# Patient Record
Sex: Female | Born: 1985 | Race: Black or African American | Hispanic: No | Marital: Single | Smoking: Never smoker
Health system: Southern US, Community
[De-identification: ages and names within clinical notes are randomized; demographics above are authoritative.]

## PROBLEM LIST (undated history)

## (undated) ENCOUNTER — Inpatient Hospital Stay (HOSPITAL_COMMUNITY): Payer: Self-pay

## (undated) DIAGNOSIS — I10 Essential (primary) hypertension: Secondary | ICD-10-CM

## (undated) DIAGNOSIS — R51 Headache: Secondary | ICD-10-CM

## (undated) DIAGNOSIS — Z8669 Personal history of other diseases of the nervous system and sense organs: Secondary | ICD-10-CM

## (undated) DIAGNOSIS — R011 Cardiac murmur, unspecified: Secondary | ICD-10-CM

## (undated) DIAGNOSIS — R519 Headache, unspecified: Secondary | ICD-10-CM

## (undated) DIAGNOSIS — D649 Anemia, unspecified: Secondary | ICD-10-CM

## (undated) DIAGNOSIS — O009 Unspecified ectopic pregnancy without intrauterine pregnancy: Secondary | ICD-10-CM

## (undated) DIAGNOSIS — R87619 Unspecified abnormal cytological findings in specimens from cervix uteri: Secondary | ICD-10-CM

## (undated) DIAGNOSIS — IMO0002 Reserved for concepts with insufficient information to code with codable children: Secondary | ICD-10-CM

## (undated) HISTORY — PX: WISDOM TOOTH EXTRACTION: SHX21

## (undated) HISTORY — DX: Unspecified ectopic pregnancy without intrauterine pregnancy: O00.90

## (undated) HISTORY — DX: Personal history of other diseases of the nervous system and sense organs: Z86.69

## (undated) HISTORY — DX: Anemia, unspecified: D64.9

## (undated) HISTORY — PX: COLPOSCOPY: SHX161

## (undated) HISTORY — DX: Reserved for concepts with insufficient information to code with codable children: IMO0002

## (undated) HISTORY — DX: Unspecified abnormal cytological findings in specimens from cervix uteri: R87.619

---

## 2011-02-24 ENCOUNTER — Inpatient Hospital Stay (HOSPITAL_COMMUNITY)
Admission: EM | Admit: 2011-02-24 | Discharge: 2011-02-26 | DRG: 378 | Disposition: A | Discharge status: Home / Self Care | Payer: BC Managed Care – PPO | Source: Ambulatory Visit | Attending: Obstetrics and Gynecology | Admitting: Obstetrics and Gynecology

## 2011-02-24 ENCOUNTER — Ambulatory Visit: Admit: 2011-02-24 | Payer: Self-pay | Admitting: Obstetrics and Gynecology

## 2011-02-24 ENCOUNTER — Other Ambulatory Visit: Payer: Self-pay | Admitting: Obstetrics and Gynecology

## 2011-02-24 ENCOUNTER — Encounter (HOSPITAL_COMMUNITY): Payer: Self-pay

## 2011-02-24 ENCOUNTER — Encounter (HOSPITAL_COMMUNITY): Payer: Self-pay | Admitting: Anesthesiology

## 2011-02-24 ENCOUNTER — Encounter (HOSPITAL_COMMUNITY)
Admission: EM | Disposition: A | Discharge status: Home / Self Care | Payer: Self-pay | Source: Ambulatory Visit | Attending: Obstetrics and Gynecology

## 2011-02-24 ENCOUNTER — Inpatient Hospital Stay (HOSPITAL_COMMUNITY): Payer: BC Managed Care – PPO | Admitting: Anesthesiology

## 2011-02-24 DIAGNOSIS — O009 Unspecified ectopic pregnancy without intrauterine pregnancy: Secondary | ICD-10-CM

## 2011-02-24 DIAGNOSIS — O00109 Unspecified tubal pregnancy without intrauterine pregnancy: Secondary | ICD-10-CM | POA: Diagnosis present

## 2011-02-24 HISTORY — PX: LAPAROSCOPY: SHX197

## 2011-02-24 HISTORY — DX: Cardiac murmur, unspecified: R01.1

## 2011-02-24 HISTORY — DX: Unspecified ectopic pregnancy without intrauterine pregnancy: O00.90

## 2011-02-24 HISTORY — PX: UNILATERAL SALPINGECTOMY: SHX6160

## 2011-02-24 HISTORY — PX: LAPAROTOMY: SHX154

## 2011-02-24 LAB — CBC
MCHC: 33.7 g/dL (ref 30.0–36.0)
Platelets: 249 10*3/uL (ref 150–400)
RDW: 12.8 % (ref 11.5–15.5)
WBC: 6.5 10*3/uL (ref 4.0–10.5)

## 2011-02-24 SURGERY — LAPAROSCOPY OPERATIVE
Anesthesia: General | Site: Abdomen | Wound class: Clean Contaminated

## 2011-02-24 MED ORDER — ONDANSETRON HCL 4 MG PO TABS: 4.0000 mg | ORAL_TABLET | Freq: Four times a day (QID) | ORAL | Status: DC | PRN

## 2011-02-24 MED ORDER — LACTATED RINGERS IV SOLN
Freq: Once | INTRAVENOUS | Status: AC
  Administered 2011-02-24: 16:00:00 via INTRAVENOUS

## 2011-02-24 MED ORDER — LACTATED RINGERS IV SOLN
INTRAVENOUS | Status: DC | PRN
  Administered 2011-02-24 (×3): via INTRAVENOUS

## 2011-02-24 MED ORDER — FAMOTIDINE IN NACL 20-0.9 MG/50ML-% IV SOLN
20.0000 mg | Freq: Once | INTRAVENOUS | Status: AC
  Administered 2011-02-24: 20 mg via INTRAVENOUS
  Filled 2011-02-24: qty 50

## 2011-02-24 MED ORDER — GLYCOPYRROLATE 0.2 MG/ML IJ SOLN
INTRAMUSCULAR | Status: DC | PRN
  Administered 2011-02-24: .6 mg via INTRAVENOUS

## 2011-02-24 MED ORDER — HYDROCODONE-ACETAMINOPHEN 5-325 MG PO TABS
1.0000 | ORAL_TABLET | ORAL | Status: DC | PRN
  Administered 2011-02-25 – 2011-02-26 (×4): 2 via ORAL
  Filled 2011-02-24: qty 1
  Filled 2011-02-24 (×3): qty 2

## 2011-02-24 MED ORDER — ZOLPIDEM TARTRATE 5 MG PO TABS: 5.0000 mg | ORAL_TABLET | Freq: Every evening | ORAL | Status: DC | PRN

## 2011-02-24 MED ORDER — HYDROMORPHONE HCL 1 MG/ML IJ SOLN
0.2000 mg | INTRAMUSCULAR | Status: DC | PRN
  Administered 2011-02-24 – 2011-02-25 (×2): 0.6 mg via INTRAVENOUS
  Filled 2011-02-24 (×2): qty 1

## 2011-02-24 MED ORDER — CEFAZOLIN SODIUM-DEXTROSE 1-4 GM-% IV SOLR
1.0000 g | Freq: Once | INTRAVENOUS | Status: DC
  Filled 2011-02-24: qty 50

## 2011-02-24 MED ORDER — ACETAMINOPHEN 325 MG PO TABS: 325.0000 mg | ORAL_TABLET | ORAL | Status: DC | PRN

## 2011-02-24 MED ORDER — MEPERIDINE HCL 25 MG/ML IJ SOLN: 6.2500 mg | INTRAMUSCULAR | Status: DC | PRN

## 2011-02-24 MED ORDER — KETOROLAC TROMETHAMINE 30 MG/ML IJ SOLN
30.0000 mg | Freq: Once | INTRAMUSCULAR | Status: AC
  Administered 2011-02-24: 30 mg via INTRAVENOUS

## 2011-02-24 MED ORDER — NEOSTIGMINE METHYLSULFATE 1 MG/ML IJ SOLN
INTRAMUSCULAR | Status: DC | PRN
  Administered 2011-02-24: 3 mg via INTRAMUSCULAR

## 2011-02-24 MED ORDER — FENTANYL CITRATE 0.05 MG/ML IJ SOLN
INTRAMUSCULAR | Status: DC | PRN
  Administered 2011-02-24: 100 ug via INTRAVENOUS
  Administered 2011-02-24: 50 ug via INTRAVENOUS
  Administered 2011-02-24: 100 ug via INTRAVENOUS

## 2011-02-24 MED ORDER — BISACODYL 5 MG PO TBEC
5.0000 mg | DELAYED_RELEASE_TABLET | Freq: Every day | ORAL | Status: DC | PRN
  Administered 2011-02-26: 5 mg via ORAL
  Filled 2011-02-24: qty 1

## 2011-02-24 MED ORDER — ONDANSETRON HCL 4 MG/2ML IJ SOLN: 4.0000 mg | Freq: Once | INTRAMUSCULAR | Status: DC | PRN

## 2011-02-24 MED ORDER — DEXAMETHASONE SODIUM PHOSPHATE 10 MG/ML IJ SOLN
INTRAMUSCULAR | Status: DC | PRN
  Administered 2011-02-24: 10 mg via INTRAVENOUS

## 2011-02-24 MED ORDER — MIDAZOLAM HCL 5 MG/5ML IJ SOLN
INTRAMUSCULAR | Status: DC | PRN
  Administered 2011-02-24: 2 mg via INTRAVENOUS

## 2011-02-24 MED ORDER — FENTANYL CITRATE 0.05 MG/ML IJ SOLN
25.0000 ug | INTRAMUSCULAR | Status: DC | PRN
  Administered 2011-02-24 (×3): 50 ug via INTRAVENOUS

## 2011-02-24 MED ORDER — IBUPROFEN 600 MG PO TABS
600.0000 mg | ORAL_TABLET | Freq: Four times a day (QID) | ORAL | Status: DC | PRN
  Administered 2011-02-25 – 2011-02-26 (×2): 600 mg via ORAL
  Filled 2011-02-24: qty 1
  Filled 2011-02-24: qty 3
  Filled 2011-02-24: qty 1

## 2011-02-24 MED ORDER — DEXTROSE IN LACTATED RINGERS 5 % IV SOLN
INTRAVENOUS | Status: DC
  Administered 2011-02-25: 02:00:00 via INTRAVENOUS

## 2011-02-24 MED ORDER — LIDOCAINE HCL (CARDIAC) 20 MG/ML IV SOLN
INTRAVENOUS | Status: DC | PRN
  Administered 2011-02-24: 60 mg via INTRAVENOUS

## 2011-02-24 MED ORDER — CEFAZOLIN SODIUM 1-5 GM-% IV SOLN
INTRAVENOUS | Status: DC | PRN
  Administered 2011-02-24: 1 g via INTRAVENOUS

## 2011-02-24 MED ORDER — ROCURONIUM BROMIDE 100 MG/10ML IV SOLN
INTRAVENOUS | Status: DC | PRN
  Administered 2011-02-24: 50 mg via INTRAVENOUS

## 2011-02-24 MED ORDER — HYDROMORPHONE HCL 1 MG/ML IJ SOLN
INTRAMUSCULAR | Status: DC | PRN
  Administered 2011-02-24: 1 mg via INTRAVENOUS

## 2011-02-24 MED ORDER — PROPOFOL 10 MG/ML IV EMUL
INTRAVENOUS | Status: DC | PRN
  Administered 2011-02-24: 200 mg via INTRAVENOUS
  Administered 2011-02-24: 50 mg via INTRAVENOUS

## 2011-02-24 MED ORDER — CITRIC ACID-SODIUM CITRATE 334-500 MG/5ML PO SOLN
30.0000 mL | Freq: Once | ORAL | Status: AC
  Administered 2011-02-24: 30 mL via ORAL
  Filled 2011-02-24: qty 30

## 2011-02-24 MED ORDER — ALUM & MAG HYDROXIDE-SIMETH 200-200-20 MG/5ML PO SUSP
30.0000 mL | ORAL | Status: DC | PRN
  Filled 2011-02-24: qty 30

## 2011-02-24 MED ORDER — ONDANSETRON HCL 4 MG/2ML IJ SOLN
INTRAMUSCULAR | Status: DC | PRN
  Administered 2011-02-24: 4 mg via INTRAMUSCULAR

## 2011-02-24 SURGICAL SUPPLY — 46 items
BENZOIN TINCTURE PRP APPL 2/3 (GAUZE/BANDAGES/DRESSINGS) ×3 IMPLANT
CABLE HIGH FREQUENCY MONO STRZ (ELECTRODE) IMPLANT
CANISTER SUCTION 2500CC (MISCELLANEOUS) ×3 IMPLANT
CLOTH BEACON ORANGE TIMEOUT ST (SAFETY) ×3 IMPLANT
CONT PATH 16OZ SNAP LID 3702 (MISCELLANEOUS) ×3 IMPLANT
DISSECTOR SPONGE CHERRY (GAUZE/BANDAGES/DRESSINGS) IMPLANT
DRAPE UTILITY XL STRL (DRAPES) ×3 IMPLANT
DRESSING TELFA 8X3 (GAUZE/BANDAGES/DRESSINGS) ×3 IMPLANT
ELECT CAUTERY BLADE 6.4 (BLADE) ×3 IMPLANT
ELECT NEEDLE TIP 2.8 STRL (NEEDLE) ×3 IMPLANT
FORCEPS CUTTING 33CM 5MM (CUTTING FORCEPS) IMPLANT
GLOVE BIO SURGEON STRL SZ 6.5 (GLOVE) ×9 IMPLANT
GLOVE BIO SURGEON STRL SZ7 (GLOVE) ×3 IMPLANT
GLOVE BIOGEL PI IND STRL 7.0 (GLOVE) ×4 IMPLANT
GLOVE BIOGEL PI INDICATOR 7.0 (GLOVE) ×2
GOWN BRE IMP SLV AUR LG STRL (GOWN DISPOSABLE) ×6 IMPLANT
GOWN BRE IMP SLV AUR XL STRL (GOWN DISPOSABLE) ×3 IMPLANT
HEMOSTAT SURGICEL 2X3 (HEMOSTASIS) ×3 IMPLANT
NS IRRIG 1000ML POUR BTL (IV SOLUTION) ×3 IMPLANT
PACK LAPAROSCOPY BASIN (CUSTOM PROCEDURE TRAY) IMPLANT
PAD ABD 7.5X8 STRL (GAUZE/BANDAGES/DRESSINGS) ×3 IMPLANT
PAD OB MATERNITY 4.3X12.25 (PERSONAL CARE ITEMS) ×3 IMPLANT
POUCH SPECIMEN RETRIEVAL 10MM (ENDOMECHANICALS) IMPLANT
SET IRRIG TUBING LAPAROSCOPIC (IRRIGATION / IRRIGATOR) IMPLANT
SLEEVE Z-THREAD 5X100MM (TROCAR) IMPLANT
SPONGE LAP 18X18 X RAY DECT (DISPOSABLE) ×6 IMPLANT
STAPLER VISISTAT 35W (STAPLE) ×3 IMPLANT
STRIP CLOSURE SKIN 1/2X4 (GAUZE/BANDAGES/DRESSINGS) ×3 IMPLANT
SUT CHROMIC 2 0 CT 1 (SUTURE) ×3 IMPLANT
SUT PLAIN 2 0 XLH (SUTURE) ×3 IMPLANT
SUT VIC AB 0 CT1 18XCR BRD8 (SUTURE) IMPLANT
SUT VIC AB 0 CT1 36 (SUTURE) ×6 IMPLANT
SUT VIC AB 0 CT1 8-18 (SUTURE)
SUT VIC AB 4-0 KS 27 (SUTURE) ×3 IMPLANT
SUT VIC AB 4-0 PS2 18 (SUTURE) ×3 IMPLANT
SUT VIC AB 4-0 SH 27 (SUTURE) ×2
SUT VIC AB 4-0 SH 27XANBCTRL (SUTURE) ×4 IMPLANT
SUT VICRYL 0 TIES 12 18 (SUTURE) IMPLANT
SUT VICRYL 0 UR6 27IN ABS (SUTURE) ×3 IMPLANT
SYR BULB 3OZ (MISCELLANEOUS) ×3 IMPLANT
TOWEL OR 17X24 6PK STRL BLUE (TOWEL DISPOSABLE) ×6 IMPLANT
TRAY FOLEY CATH 14FR (SET/KITS/TRAYS/PACK) ×3 IMPLANT
TROCAR Z-THREAD FIOS 11X100 BL (TROCAR) IMPLANT
TROCAR Z-THREAD FIOS 5X100MM (TROCAR) ×3 IMPLANT
WARMER LAPAROSCOPE (MISCELLANEOUS) ×3 IMPLANT
WATER STERILE IRR 1000ML POUR (IV SOLUTION) ×3 IMPLANT

## 2011-02-24 NOTE — Anesthesia Postprocedure Evaluation (Signed)
  Anesthesia Post-op Note  Patient: Sarah Duran  Procedure(s) Performed:  LAPAROSCOPY OPERATIVE; EXPLORATORY LAPAROTOMY Patient is awake and responsive. Pain and nausea are reasonably well controlled. Vital signs are stable and clinically acceptable. Oxygen saturation is clinically acceptable. There are no apparent anesthetic complications at this time. Patient is ready for discharge.

## 2011-02-24 NOTE — Anesthesia Preprocedure Evaluation (Addendum)
Anesthesia Evaluation  Name, MR# and DOB Patient awake  General Assessment Comment  Reviewed: Allergy & Precautions, H&P , Patient's Chart, lab work & pertinent test results and reviewed documented beta blocker date and time   Airway Mallampati: II TM Distance: >3 FB Neck ROM: full    Dental No notable dental hx    Pulmonaryneg pulmonary ROS    clear to auscultation  pulmonary exam normal   Cardiovascular Valvular problems/murmurs: Hx of Murmur. regular Normal   Neuro/PsychNegative Neurological ROS Negative Psych ROS  GI/Hepatic/Renal negative GI ROS, negative Liver ROS, and negative Renal ROS (+)       Endo/Other  Negative Endocrine ROS (+)   Abdominal   Musculoskeletal  Hematology negative hematology ROS (+)   Peds  Reproductive/Obstetrics   Anesthesia Other Findings             Anesthesia Physical Anesthesia Plan  ASA: I  Anesthesia Plan: General   Post-op Pain Management:    Induction:   Airway Management Planned:   Additional Equipment:   Intra-op Plan:   Post-operative Plan:   Informed Consent: I have reviewed the patients History and Physical, chart, labs and discussed the procedure including the risks, benefits and alternatives for the proposed anesthesia with the patient or authorized representative who has indicated his/her understanding and acceptance.   Dental Advisory Given  Plan Discussed with: CRNA and Surgeon  Anesthesia Plan Comments: (Discussed  general anesthesia, including possible nausea, instrumentation of airway, sore throat,pulmonary aspiration, etc. I asked if the were any outstanding questions, or  concerns before we proceeded.)       Anesthesia Quick Evaluation

## 2011-02-24 NOTE — Brief Op Note (Signed)
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   Delay start of Pharmacological VTE agent (>24hrs) due to surgical blood loss or risk of bleeding:  not applicable

## 2011-02-24 NOTE — Anesthesia Procedure Notes (Addendum)
Procedure Name: Intubation Performed by: Fanny Dance Pre-anesthesia Checklist: Patient identified, Patient being monitored, Timeout performed, Emergency Drugs available and Suction available Patient Re-evaluated:Patient Re-evaluated prior to inductionOxygen Delivery Method: Circle System Utilized Preoxygenation: Pre-oxygenation with 100% oxygen Intubation Type: IV induction Ventilation: Mask ventilation without difficulty Laryngoscope Size: Miller and 2 Laryngoscope size: Miller and 2 Grade View: Grade II Tube type: Oral Tube size: 7.0 mm Number of attempts: 1 Airway Equipment and Method: stylet Placement Confirmation: ETT inserted through vocal cords under direct vision,  positive ETCO2 and breath sounds checked- equal and bilateral Secured at: 21 cm Tube secured with: Tape Dental Injury: Teeth and Oropharynx as per pre-operative assessment

## 2011-02-24 NOTE — Transfer of Care (Signed)
Immediate Anesthesia Transfer of Care Note  Patient: Sarah Duran  Procedure(s) Performed:  LAPAROSCOPY OPERATIVE; EXPLORATORY LAPAROTOMY  Patient Location: PACU  Anesthesia Type: General  Level of Consciousness: oriented and sedated  Airway & Oxygen Therapy: Patient Spontanous Breathing and Patient connected to nasal cannula oxygen  Post-op Assessment: Report given to PACU RN and Post -op Vital signs reviewed and stable  Post vital signs: Reviewed and stable  Complications: No apparent anesthesia complications

## 2011-02-24 NOTE — Progress Notes (Signed)
Pt sent from the office with orders for AOS. Pt states no pain with a little spotting.

## 2011-02-25 LAB — CBC
Hemoglobin: 10.3 g/dL — ABNORMAL LOW (ref 12.0–15.0)
Platelets: 215 10*3/uL (ref 150–400)
RBC: 3.77 MIL/uL — ABNORMAL LOW (ref 3.87–5.11)
WBC: 11.4 10*3/uL — ABNORMAL HIGH (ref 4.0–10.5)

## 2011-02-25 MED ORDER — CEPHALEXIN 500 MG PO CAPS
500.0000 mg | ORAL_CAPSULE | Freq: Three times a day (TID) | ORAL | Status: DC
  Administered 2011-02-25 – 2011-02-26 (×3): 500 mg via ORAL
  Filled 2011-02-25 (×4): qty 1

## 2011-02-25 MED ORDER — SIMETHICONE 80 MG PO CHEW
80.0000 mg | CHEWABLE_TABLET | Freq: Four times a day (QID) | ORAL | Status: DC | PRN
  Administered 2011-02-25: 80 mg via ORAL

## 2011-02-25 NOTE — Addendum Note (Signed)
Addendum  created 02/25/11 0945 by Fanny Dance   Modules edited:Anesthesia Flowsheet

## 2011-02-25 NOTE — Op Note (Signed)
NAMEAMIT, MELOY NO.:  [id number]  MEDICAL RECORD NO.:  [id number]  LOCATION:  9310                          FACILITY:  [facility]  PHYSICIAN:  Arlyce Harman, MD     DATE OF BIRTH:  [birth year]  DATE OF PROCEDURE: DATE OF DISCHARGE:                              OPERATIVE REPORT   PREOPERATIVE DIAGNOSIS:  Ectopic pregnancy.  POSTOPERATIVE DIAGNOSIS:  Ectopic pregnancy.  OPERATION:  Diagnostic laparoscopy or exploratory laparotomy with right linear salpingostomy and removal of ectopic pregnancy, lysis of adhesions.  SURGEON:  Arlyce Harman, MD  ASSISTANT:  None.  DESCRIPTION OF FINDINGS:  Right tubal pregnancy, adhesions of the right tube and ovary.  INDICATIONS AND CONSENT:  The patient is a 25 year old gravida 2, para 1, previous C-section patient who presented to the office for an exam. She denies pelvic pain.  Denies vaginal bleeding.  However, on exam, vaginal bleeding was noted by Dr. Fonnie Jarvis.  The patient is 9 weeks 5 days.  Also, the uterus did not feel that large, therefore, ultrasound was planned.  Findings were highly suggestive of an right ectopic pregnancy with a right adnexal mass separated from the uterus, the mass measuring 2.2 cm.  There was consistent with a tubal pregnancy.  The uterus was empty and within normal limits size and in the right adnexa, there appeared a small corpus luteum cyst.  The patient started to have bleeding on February 23, 2011, and reported no pain.  Because of the ultrasound findings and the quant level at over 14,000, they came back. The decision was made for laparoscopy and possible laparotomy.  Options for therapy was thoroughly discussed and consent was given.  PROCEDURE:  The patient was placed on the table in the supine position, after general anesthesia has been induced, after the patient had been prepped and draped, she was placed in a dorsal lithotomy position and then the bladder was drained via Foley  catheter.  An infraumbilical incision was made and the trocar inserted without difficulty.  We confirmed we are in the peritoneal cavity and at this point, CO2 was insufflated into the abdomen and the laparoscope was inserted.  Upon inspection of the pelvic cavity, there was no hemoperitoneum.  The right tubes and ovary looks normal.  There was an approximately 2-3 cm dilation on the right adnexal area consistent with a tubal pregnancy. There were some adhesions to that ovary on that side, they were lysed with a Bovie and released.  The right tube was gently grasped and the gestational sac was squeezed out of the mid portion of the tube.  After the mass had been removed, we can see fallopian tube was intact, ectopic pregnancy from the right fallopian tube.  SPECIMEN:  Ectopic.  DISPOSITION:  Continue meds at home.     Arlyce Harman, MD     EG/MEDQ  D:  02/24/2011  T:  02/25/2011  Job:  [id number]

## 2011-02-26 NOTE — Progress Notes (Signed)
Patient seems to be coping well.  She does not report feelings of grief over pregnancy at this time.  However, reports her boyfriend is grieving.  He was not present - two friends were with her.  Gave her ectopic Comfort packet and discussed information and resources available.

## 2011-02-26 NOTE — Progress Notes (Signed)
2 Days Post-Op Procedure(s) (LRB): LAPAROSCOPY OPERATIVE (N/A) EXPLORATORY LAPAROTOMY (N/A)  Subjective: Patient reports tolerating PO, + flatus and no problems voiding.    Objective: I have reviewed patient's vital signs, medications and labs.  General: alert, cooperative and no distress  Assessment: s/p Procedure(s): LAPAROSCOPY OPERATIVE EXPLORATORY LAPAROTOMY: stable, progressing well and tolerating diet  Plan: Discharge home  LOS: 2 days    Justyn Boyson E 02/26/2011, 8:54 AM

## 2011-02-26 NOTE — Progress Notes (Addendum)
1 day post op Procedure(s) (LRB): LAPAROSCOPY OPERATIVE (N/A) EXPLORATORY LAPAROTOMY (N/A)  Subjective: Patient reports tolerating PO.    Objective: I have reviewed patient's vital signs, intake and output, medications and labs.  General: alert and no distress  Assessment: s/p Procedure(s): LAPAROSCOPY OPERATIVE EXPLORATORY LAPAROTOMY: stable  Plan: Encourage ambulation Discontinue IV fluids    Isiac Breighner E 02/26/2011, 8:47 AM

## 2011-03-11 NOTE — Discharge Summary (Signed)
2 Days Post-Op Procedure(s) (LRB):  LAPAROSCOPY OPERATIVE (N/A)  EXPLORATORY LAPAROTOMY (N/A)  Subjective:  Patient reports tolerating PO, + flatus and no problems voiding.  Objective:  I have reviewed patient's vital signs, medications and labs.  General: alert, cooperative and no distress  Assessment:  s/p Procedure(s):  LAPAROSCOPY OPERATIVE  EXPLORATORY LAPAROTOMY: stable, progressing well and tolerating diet  Plan:  Discharge home  LOS: 2 days

## 2011-03-17 ENCOUNTER — Encounter (HOSPITAL_COMMUNITY): Payer: Self-pay | Admitting: Obstetrics and Gynecology

## 2011-03-26 ENCOUNTER — Inpatient Hospital Stay (HOSPITAL_COMMUNITY): Admit: 2011-03-26 | Payer: Self-pay

## 2011-08-13 ENCOUNTER — Other Ambulatory Visit (HOSPITAL_COMMUNITY)
Admission: RE | Admit: 2011-08-13 | Discharge: 2011-08-13 | Disposition: A | Discharge status: Home / Self Care | Payer: BC Managed Care – PPO | Source: Ambulatory Visit | Attending: Obstetrics and Gynecology | Admitting: Obstetrics and Gynecology

## 2011-08-13 ENCOUNTER — Other Ambulatory Visit: Payer: Self-pay | Admitting: Obstetrics and Gynecology

## 2011-08-13 DIAGNOSIS — Z124 Encounter for screening for malignant neoplasm of cervix: Secondary | ICD-10-CM | POA: Insufficient documentation

## 2011-08-13 DIAGNOSIS — Z113 Encounter for screening for infections with a predominantly sexual mode of transmission: Secondary | ICD-10-CM | POA: Insufficient documentation

## 2011-09-10 ENCOUNTER — Other Ambulatory Visit: Payer: Self-pay | Admitting: Obstetrics and Gynecology

## 2012-08-17 DIAGNOSIS — I10 Essential (primary) hypertension: Secondary | ICD-10-CM

## 2012-08-17 HISTORY — DX: Essential (primary) hypertension: I10

## 2012-10-24 ENCOUNTER — Encounter: Payer: Self-pay | Admitting: Obstetrics and Gynecology

## 2012-10-24 ENCOUNTER — Ambulatory Visit: Payer: BC Managed Care – PPO | Admitting: Obstetrics and Gynecology

## 2012-10-24 VITALS — BP 100/62 | HR 68 | Ht 65.0 in | Wt 144.0 lb

## 2012-10-24 DIAGNOSIS — Z01419 Encounter for gynecological examination (general) (routine) without abnormal findings: Secondary | ICD-10-CM

## 2012-10-24 DIAGNOSIS — Z8759 Personal history of other complications of pregnancy, childbirth and the puerperium: Secondary | ICD-10-CM

## 2012-10-24 DIAGNOSIS — N912 Amenorrhea, unspecified: Secondary | ICD-10-CM

## 2012-10-24 DIAGNOSIS — Z113 Encounter for screening for infections with a predominantly sexual mode of transmission: Secondary | ICD-10-CM

## 2012-10-24 DIAGNOSIS — Z3490 Encounter for supervision of normal pregnancy, unspecified, unspecified trimester: Secondary | ICD-10-CM

## 2012-10-24 DIAGNOSIS — Z124 Encounter for screening for malignant neoplasm of cervix: Secondary | ICD-10-CM

## 2012-10-24 NOTE — Progress Notes (Signed)
Regular Periods: no Mammogram: no  Monthly Breast Ex.: yes Exercise: yes 2-3 x a week treadmill  Tetanus < 10 years: yes Seatbelts: yes  NI. Bladder Functn.: yes Abuse at home: no  Daily BM's: no qod Stressful Work: no  Healthy Diet: yes Sigmoid-Colonoscopy: no  Calcium: no Medical problems this year: amenorrhea;hx of ectopic   LAST PAP:2012  Contraception: none  Mammogram:  no  PCP: no   PMH: no change  FMH: no change  Last Bone Scan: no  Pt is single; in a relationship

## 2012-10-24 NOTE — Progress Notes (Signed)
Subjective:    Audianna Landgren is a 27 y.o. female, G2P0011, who presents for an annual exam. The patient reports a normal period in September 04, 2012 and no bleeding or pelvic discomfort since.  Menstrual cycle:   LMP: Patient's last menstrual period was 09/04/2012.  PMH: S/P colposcopy 2013 b             Review of Systems Pertinent items are noted in HPI. Denies pelvic pain, urinary tract symptoms, vaginitis symptoms, irregular bleeding, menopausal symptoms, change in bowel habits or rectal bleeding   Objective:    BP 100/62  Pulse 68  Ht 5\' 5"  (1.651 m)  Wt 144 lb (65.318 kg)  BMI 23.96 kg/m2  LMP 09/04/2012  Breastfeeding? Unknown    Wt Readings from Last 1 Encounters:  10/24/12 144 lb (65.318 kg)   Body mass index is 23.96 kg/(m^2). General Appearance: Alert, no acute distress HEENT: Grossly normal Neck / Thyroid: Supple, no thyromegaly or cervical adenopathy Lungs: Clear to auscultation bilaterally Back: No CVA tenderness Breast Exam: No masses or nodes.No dimpling, nipple retraction or discharge. Cardiovascular: Regular rate and rhythm.  Gastrointestinal: Soft, non-tender, no masses or organomegaly Pelvic Exam: EGBUS-wnl, vagina-normal rugae, cervix- without lesions or tenderness, uterus appears normal size shape and consistency, adnexae-no masses or tenderness Rectovaginal: no masses and normal sphincter tone Lymphatic Exam: Non-palpable nodes in neck, clavicular,  axillary, or inguinal regions  Skin: no rashes or abnormalities Extremities: no clubbing cyanosis or edema  Neurologic: grossly normal Psychiatric: Alert and oriented  UPT:  poisitive   Assessment:   Routine GYN Exam Pregnant  [redacted] weeks by dates H/O Ectopic-S/P Right Salpingectomy   Plan:  QHCG-pending;  STD testing-pending  If QHCG > 2500 will do ultrasound  PAP sent  RTO 1 year or prn  Harue Pribble,ELMIRAPA-C

## 2012-10-25 ENCOUNTER — Telehealth: Payer: Self-pay

## 2012-10-25 DIAGNOSIS — O0910 Supervision of pregnancy with history of ectopic or molar pregnancy, unspecified trimester: Secondary | ICD-10-CM

## 2012-10-25 NOTE — Telephone Encounter (Signed)
Informed pt quant is 114k and u/s is needed per EP. Appt scheduled 10/28/12 at 1 pm then f/u with LC. Pt agrees and has no further questions.

## 2012-10-25 NOTE — Telephone Encounter (Signed)
Message copied by Janeece Agee on Tue Oct 25, 2012  9:46 AM ------      Message from: Henreitta Leber      Created: Tue Oct 25, 2012  8:56 AM      Regarding: Need for Ultrasound       This patient has a history of ectopic and is now pregnant with a QHCG of 114K and needs an ultrasound with the diagnosis: history of ectopic, currently pregnant.  Thank you,  EP            ----- Message -----         From: [facility]         Sent: 10/25/2012   7:00 AM           To: Henreitta Leber, PA-C                   ------

## 2012-10-26 ENCOUNTER — Encounter: Payer: BC Managed Care – PPO | Admitting: Certified Nurse Midwife

## 2012-10-26 LAB — PAP IG, CT-NG, RFX HPV ASCU

## 2012-10-27 LAB — HUMAN PAPILLOMAVIRUS, HIGH RISK: HPV DNA High Risk: NOT DETECTED

## 2012-10-28 ENCOUNTER — Encounter: Payer: BC Managed Care – PPO | Admitting: Family Medicine

## 2012-11-10 DIAGNOSIS — IMO0002 Reserved for concepts with insufficient information to code with codable children: Secondary | ICD-10-CM | POA: Insufficient documentation

## 2012-11-21 ENCOUNTER — Encounter: Payer: Self-pay | Admitting: Obstetrics and Gynecology

## 2012-12-23 ENCOUNTER — Encounter (HOSPITAL_COMMUNITY): Payer: Self-pay | Admitting: Family

## 2012-12-23 ENCOUNTER — Inpatient Hospital Stay (HOSPITAL_COMMUNITY): Payer: BC Managed Care – PPO

## 2012-12-23 ENCOUNTER — Inpatient Hospital Stay (HOSPITAL_COMMUNITY)
Admission: AD | Admit: 2012-12-23 | Discharge: 2012-12-23 | Disposition: A | Discharge status: Home / Self Care | Payer: BC Managed Care – PPO | Source: Ambulatory Visit | Attending: Obstetrics and Gynecology | Admitting: Obstetrics and Gynecology

## 2012-12-23 DIAGNOSIS — M549 Dorsalgia, unspecified: Secondary | ICD-10-CM

## 2012-12-23 DIAGNOSIS — M545 Low back pain, unspecified: Secondary | ICD-10-CM | POA: Insufficient documentation

## 2012-12-23 DIAGNOSIS — N390 Urinary tract infection, site not specified: Secondary | ICD-10-CM | POA: Insufficient documentation

## 2012-12-23 DIAGNOSIS — R109 Unspecified abdominal pain: Secondary | ICD-10-CM | POA: Insufficient documentation

## 2012-12-23 DIAGNOSIS — O2390 Unspecified genitourinary tract infection in pregnancy, unspecified trimester: Secondary | ICD-10-CM | POA: Insufficient documentation

## 2012-12-23 DIAGNOSIS — O4692 Antepartum hemorrhage, unspecified, second trimester: Secondary | ICD-10-CM

## 2012-12-23 LAB — CBC
MCV: 82 fL (ref 78.0–100.0)
Platelets: 263 10*3/uL (ref 150–400)
RBC: 4.1 MIL/uL (ref 3.87–5.11)
WBC: 9.1 10*3/uL (ref 4.0–10.5)

## 2012-12-23 LAB — URINALYSIS, ROUTINE W REFLEX MICROSCOPIC
Bilirubin Urine: NEGATIVE
Hgb urine dipstick: NEGATIVE
Protein, ur: NEGATIVE mg/dL
Specific Gravity, Urine: 1.025 (ref 1.005–1.030)
Urobilinogen, UA: 1 mg/dL (ref 0.0–1.0)

## 2012-12-23 LAB — WET PREP, GENITAL: Trich, Wet Prep: NONE SEEN

## 2012-12-23 NOTE — MAU Provider Note (Signed)
History   26yo G3P1011 at [redacted]w[redacted]d with cramping starting this am. Bleeding noted at 1330 on toilet tissue and in toilet.  No bleeding since.  [place] at 8 weeks for viability.    Chief Complaint  Patient presents with  . Abdominal Pain    OB History   Grav Para Term Preterm Abortions TAB SAB Ect Mult Living   3 1 1  1  0  1  1      Past Medical History  Diagnosis Date  . Heart murmur   . Ectopic pregnancy 02/24/2011  . Hx of migraines     pt states that it is sometimes just frequent headaches  . Anemia   . Abnormal Pap smear     2012    Past Surgical History  Procedure Laterality Date  . Cesarean section    . Laparoscopy  02/24/2011    Procedure: LAPAROSCOPY OPERATIVE;  Surgeon: Fortino Sic, MD;  Location: [facility];  Service: Gynecology;  Laterality: N/A;  . Laparotomy  02/24/2011    Procedure: EXPLORATORY LAPAROTOMY;  Surgeon: Fortino Sic, MD;  Location: [facility];  Service: Gynecology;  Laterality: N/A;  . Wisdom tooth extraction    . Colposcopy      2012?  Marland Kitchen Unilateral salpingectomy  02/24/2011    Right due to ectopic     Family History  Problem Relation Age of Onset  . Heart disease Paternal Grandmother   . Stroke Paternal Grandmother   . Diabetes Paternal Grandmother   . Cancer Maternal Grandmother     ? kind  . Cancer Maternal Grandfather     ? kind  . Hypertension Mother   . Sickle cell trait Sister   . Hypertension Sister   . Breast cancer Maternal Aunt   . Cervical cancer Maternal Aunt   . Other Child     intestinal problems  . Rheumatic fever Maternal Uncle   . Hypertension Sister     History  Substance Use Topics  . Smoking status: Never Smoker   . Smokeless tobacco: Never Used  . Alcohol Use: No    Allergies: No Known Allergies  Prescriptions prior to admission  Medication Sig Dispense Refill  . acetaminophen (TYLENOL) 500 MG tablet Take 1,000 mg by mouth every 6 (six) hours as needed for pain.      . Prenatal Vit-Fe Fumarate-FA (PRENATAL  MULTIVITAMIN) TABS Take 1 tablet by mouth daily at 12 noon.        ROS: see HPI above, all other systems neg  Physical Exam   Blood pressure 136/78, pulse 101, temperature 98.8 F (37.1 C), temperature source Oral, resp. rate 20, height 5\' 5"  (1.651 m), weight 147 lb (66.679 kg), last menstrual period 09/04/2012, unknown if currently breastfeeding.  Chest: clear Heart: RRR Abdomen: Gravid, NT Extremities: WNL  Pelvic: closed / Thick, no VB seen Dopplers: 163  ED Course  IUP at [redacted]w[redacted]d Complete abx for UTI 1 week ago  Limited U/S  CBC Urine cx Motrin q 6hr  OXLEY, JENNIFER CNM, [place] 12/23/2012 5:52 PM  Addendum: Pt denies further VB.  C/o low back pain.  Sits at work.  S.o. At bs.   U/s WNL: SIUP w/ AUA=[redacted]w[redacted]d per [employer].  Placenta posterior and fundal.  AFI WNL.  Cx=3.5cm and WNL per TA scan.                  Variable position.  Ovaries not seen; adnexa w/o masses.  FHR=160. A/P [redacted]w[redacted]d Self-limiting VB O  pos Normal limited OB u/s emesisx2 earlier in day Back pain in pregnancy  Keep already scheduled appt at [facility], or f/u prn.  Pelvic rest until 7 consecutive days w/o VB.  Motrin po prn pain through 30 weeks.  Comfort measures rev'd for back pain.  Rexene Edison, CNM 12/23/12 2038

## 2012-12-23 NOTE — MAU Note (Signed)
Neg CVA tenderness. 

## 2012-12-23 NOTE — MAU Note (Signed)
Patient presents to MAU with c/o LLQ abdominal pain since 0430 today. Reports intermittent vaginal bleeding since 1330 today. Pain has worsened since bleeding began.  Denies antecedent trauma or sexual intercourse.

## 2012-12-23 NOTE — MAU Note (Signed)
Since woke up this morning has been having cramping in abd and low back.  Last time she went to the bathroom there were spots of blood.  Pain is making her short of breath.

## 2012-12-25 LAB — URINE CULTURE: Colony Count: 45000

## 2013-05-18 LAB — OB RESULTS CONSOLE GC/CHLAMYDIA: Gonorrhea: NEGATIVE

## 2013-05-18 LAB — OB RESULTS CONSOLE GBS: GBS: NEGATIVE

## 2013-06-12 ENCOUNTER — Encounter (HOSPITAL_COMMUNITY): Payer: Self-pay

## 2013-06-12 ENCOUNTER — Inpatient Hospital Stay (HOSPITAL_COMMUNITY)
Admission: AD | Admit: 2013-06-12 | Discharge: 2013-06-17 | DRG: 765 | Disposition: A | Discharge status: Home / Self Care | Payer: Medicaid Other | Source: Ambulatory Visit | Attending: Obstetrics and Gynecology | Admitting: Obstetrics and Gynecology

## 2013-06-12 DIAGNOSIS — O133 Gestational [pregnancy-induced] hypertension without significant proteinuria, third trimester: Secondary | ICD-10-CM | POA: Diagnosis present

## 2013-06-12 DIAGNOSIS — R7689 Other specified abnormal immunological findings in serum: Secondary | ICD-10-CM | POA: Diagnosis present

## 2013-06-12 DIAGNOSIS — O34219 Maternal care for unspecified type scar from previous cesarean delivery: Secondary | ICD-10-CM | POA: Diagnosis present

## 2013-06-12 DIAGNOSIS — IMO0002 Reserved for concepts with insufficient information to code with codable children: Secondary | ICD-10-CM | POA: Diagnosis present

## 2013-06-12 DIAGNOSIS — R768 Other specified abnormal immunological findings in serum: Secondary | ICD-10-CM | POA: Diagnosis present

## 2013-06-12 DIAGNOSIS — O480 Post-term pregnancy: Secondary | ICD-10-CM | POA: Diagnosis present

## 2013-06-12 DIAGNOSIS — O9903 Anemia complicating the puerperium: Secondary | ICD-10-CM | POA: Diagnosis not present

## 2013-06-12 DIAGNOSIS — A6000 Herpesviral infection of urogenital system, unspecified: Secondary | ICD-10-CM | POA: Diagnosis present

## 2013-06-12 DIAGNOSIS — O411090 Infection of amniotic sac and membranes, unspecified, unspecified trimester, not applicable or unspecified: Secondary | ICD-10-CM | POA: Diagnosis present

## 2013-06-12 DIAGNOSIS — Z98891 History of uterine scar from previous surgery: Secondary | ICD-10-CM

## 2013-06-12 DIAGNOSIS — O98519 Other viral diseases complicating pregnancy, unspecified trimester: Secondary | ICD-10-CM | POA: Diagnosis present

## 2013-06-12 DIAGNOSIS — D649 Anemia, unspecified: Secondary | ICD-10-CM | POA: Diagnosis not present

## 2013-06-12 HISTORY — DX: Essential (primary) hypertension: I10

## 2013-06-12 LAB — TYPE AND SCREEN

## 2013-06-12 LAB — URIC ACID: Uric Acid, Serum: 4.8 mg/dL (ref 2.4–7.0)

## 2013-06-12 LAB — COMPREHENSIVE METABOLIC PANEL
AST: 15 U/L (ref 0–37)
Albumin: 2.6 g/dL — ABNORMAL LOW (ref 3.5–5.2)
Calcium: 9.5 mg/dL (ref 8.4–10.5)
Creatinine, Ser: 0.62 mg/dL (ref 0.50–1.10)
Total Protein: 7 g/dL (ref 6.0–8.3)

## 2013-06-12 LAB — URINALYSIS, ROUTINE W REFLEX MICROSCOPIC
Glucose, UA: NEGATIVE mg/dL
Leukocytes, UA: NEGATIVE
Nitrite: NEGATIVE
pH: 7 (ref 5.0–8.0)

## 2013-06-12 LAB — PROTEIN / CREATININE RATIO, URINE
Creatinine, Urine: 74.15 mg/dL
Protein Creatinine Ratio: 0.09 (ref 0.00–0.15)

## 2013-06-12 LAB — CBC
MCH: 28.2 pg (ref 26.0–34.0)
MCHC: 33.3 g/dL (ref 30.0–36.0)
MCV: 84.6 fL (ref 78.0–100.0)
Platelets: 246 10*3/uL (ref 150–400)
RDW: 14.2 % (ref 11.5–15.5)
WBC: 9.4 10*3/uL (ref 4.0–10.5)

## 2013-06-12 LAB — OB RESULTS CONSOLE HEPATITIS B SURFACE ANTIGEN: Hepatitis B Surface Ag: NEGATIVE

## 2013-06-12 LAB — OB RESULTS CONSOLE RUBELLA ANTIBODY, IGM: Rubella: IMMUNE

## 2013-06-12 LAB — OB RESULTS CONSOLE ANTIBODY SCREEN: Antibody Screen: NEGATIVE

## 2013-06-12 LAB — AMNISURE RUPTURE OF MEMBRANE (ROM) NOT AT ARMC: Amnisure ROM: NEGATIVE

## 2013-06-12 MED ORDER — TERBUTALINE SULFATE 1 MG/ML IJ SOLN: 0.2500 mg | Freq: Once | INTRAMUSCULAR | Status: AC | PRN

## 2013-06-12 MED ORDER — ONDANSETRON HCL 4 MG/2ML IJ SOLN: 4.0000 mg | Freq: Four times a day (QID) | INTRAMUSCULAR | Status: DC | PRN

## 2013-06-12 MED ORDER — ACETAMINOPHEN 325 MG PO TABS
650.0000 mg | ORAL_TABLET | ORAL | Status: DC | PRN
  Administered 2013-06-13: 650 mg via ORAL
  Filled 2013-06-12: qty 2

## 2013-06-12 MED ORDER — OXYTOCIN 40 UNITS IN LACTATED RINGERS INFUSION - SIMPLE MED
1.0000 m[IU]/min | INTRAVENOUS | Status: DC
  Administered 2013-06-13: 1 m[IU]/min via INTRAVENOUS
  Administered 2013-06-13: 5 m[IU]/min via INTRAVENOUS
  Filled 2013-06-12: qty 1000

## 2013-06-12 MED ORDER — OXYTOCIN BOLUS FROM INFUSION: 500.0000 mL | INTRAVENOUS | Status: DC

## 2013-06-12 MED ORDER — VALACYCLOVIR HCL 500 MG PO TABS
500.0000 mg | ORAL_TABLET | Freq: Every day | ORAL | Status: DC
  Administered 2013-06-12 – 2013-06-13 (×2): 500 mg via ORAL
  Filled 2013-06-12 (×3): qty 1

## 2013-06-12 MED ORDER — IBUPROFEN 600 MG PO TABS: 600.0000 mg | ORAL_TABLET | Freq: Four times a day (QID) | ORAL | Status: DC | PRN

## 2013-06-12 MED ORDER — LACTATED RINGERS IV SOLN
500.0000 mL | INTRAVENOUS | Status: DC | PRN
  Administered 2013-06-12: 1000 mL via INTRAVENOUS
  Administered 2013-06-13 (×2): 500 mL via INTRAVENOUS

## 2013-06-12 MED ORDER — SODIUM CHLORIDE 0.9 % IJ SOLN: 3.0000 mL | Freq: Two times a day (BID) | INTRAMUSCULAR | Status: DC

## 2013-06-12 MED ORDER — PROMETHAZINE HCL 25 MG/ML IJ SOLN
25.0000 mg | Freq: Once | INTRAMUSCULAR | Status: AC
  Administered 2013-06-13: 25 mg via INTRAVENOUS
  Filled 2013-06-12: qty 1

## 2013-06-12 MED ORDER — ZOLPIDEM TARTRATE 5 MG PO TABS: 5.0000 mg | ORAL_TABLET | Freq: Every evening | ORAL | Status: DC | PRN

## 2013-06-12 MED ORDER — NALBUPHINE SYRINGE 5 MG/0.5 ML
10.0000 mg | INJECTION | INTRAMUSCULAR | Status: DC | PRN
  Administered 2013-06-13 (×2): 10 mg via INTRAVENOUS
  Filled 2013-06-12 (×4): qty 1

## 2013-06-12 MED ORDER — ZOLPIDEM TARTRATE 5 MG PO TABS
5.0000 mg | ORAL_TABLET | Freq: Every evening | ORAL | Status: DC | PRN
  Administered 2013-06-13: 5 mg via ORAL
  Filled 2013-06-12: qty 1

## 2013-06-12 MED ORDER — OXYTOCIN 40 UNITS IN LACTATED RINGERS INFUSION - SIMPLE MED: 62.5000 mL/h | INTRAVENOUS | Status: DC

## 2013-06-12 MED ORDER — OXYCODONE-ACETAMINOPHEN 5-325 MG PO TABS: 1.0000 | ORAL_TABLET | ORAL | Status: DC | PRN

## 2013-06-12 MED ORDER — LABETALOL HCL 200 MG PO TABS
200.0000 mg | ORAL_TABLET | Freq: Two times a day (BID) | ORAL | Status: DC
  Administered 2013-06-12 (×2): 200 mg via ORAL
  Filled 2013-06-12 (×3): qty 1

## 2013-06-12 MED ORDER — SODIUM CHLORIDE 0.9 % IJ SOLN: 3.0000 mL | INTRAMUSCULAR | Status: DC | PRN

## 2013-06-12 MED ORDER — CITRIC ACID-SODIUM CITRATE 334-500 MG/5ML PO SOLN
30.0000 mL | ORAL | Status: DC | PRN
  Administered 2013-06-13 – 2013-06-14 (×2): 30 mL via ORAL
  Filled 2013-06-12 (×2): qty 15

## 2013-06-12 MED ORDER — SODIUM CHLORIDE 0.9 % IV SOLN: 250.0000 mL | INTRAVENOUS | Status: DC | PRN

## 2013-06-12 MED ORDER — LIDOCAINE HCL (PF) 1 % IJ SOLN: 30.0000 mL | INTRAMUSCULAR | Status: DC | PRN

## 2013-06-12 NOTE — Progress Notes (Signed)
Notified pt in MAU for eval of possible ROM, fern slide negative, orders to obtain amnisure and will check and call with results.

## 2013-06-12 NOTE — MAU Note (Signed)
Pt will go to room 167

## 2013-06-12 NOTE — H&P (Signed)
Sarah Duran is a 27 y.o. female presenting for evaluation of possible rupture of membrane and contractions. The patient gives a history of contractions which began around 6 AM. She arose to have her shower and thought that she had leaking that went down. Her legs. She has had a previous cesarean section and wants to have vaginal birth after cesarean section for this delivery. She has reviewed the information sheet concerning prior cesarean section including the risks and benefits of repeat cesarean section and trial of labor after cesarean section. She is knowledge is no further questions and wishes to proceed with labor. She was observed over several hours without change in her cervix. She did, however, have elevations in her blood pressure that required treatment. She has no history of hypertension.. Started. Her prenatal care at [redacted] weeks gestation at [facility] .  Her prenatal laboratory studies and prenatal course had been normal since that time.  Maternal Medical History:  Reason for admission: Contractions.   Contractions: Onset was 3-5 hours ago.   Frequency: irregular.   Perceived severity is mild.    Fetal activity: Perceived fetal activity is normal.   Last perceived fetal movement was within the past hour.      OB History   Grav Para Term Preterm Abortions TAB SAB Ect Mult Living   3 1 1  1  0  1  1     Past Medical History  Diagnosis Date  . Heart murmur   . Ectopic pregnancy 02/24/2011  . Hx of migraines     pt states that it is sometimes just frequent headaches  . Anemia   . Abnormal Pap smear     2012  . Preterm labor    Past Surgical History  Procedure Laterality Date  . Cesarean section    . Laparoscopy  02/24/2011    Procedure: LAPAROSCOPY OPERATIVE;  Surgeon: Fortino Sic, MD;  Location: [facility];  Service: Gynecology;  Laterality: N/A;  . Laparotomy  02/24/2011    Procedure: EXPLORATORY LAPAROTOMY;  Surgeon: Fortino Sic, MD;  Location: [facility]  [facility];  Service: Gynecology;  Laterality: N/A;  . Wisdom tooth extraction    . Colposcopy      2012?  Marland Kitchen Unilateral salpingectomy  02/24/2011    Right due to ectopic    Family History: family history includes Breast cancer in her maternal aunt; Cancer in her maternal grandfather and maternal grandmother; Cervical cancer in her maternal aunt; Diabetes in her paternal grandmother; Heart disease in her paternal grandmother; Hypertension in her mother, sister, and sister; Other in her child; Rheumatic fever in her maternal uncle; Sickle cell trait in her sister; Stroke in her paternal grandmother. Social History:  reports that she has never smoked. She has never used smokeless tobacco. She reports that she does not drink alcohol or use illicit drugs.   Prenatal Transfer Tool  Maternal Diabetes: No Genetic Screening: Too late to care Maternal Ultrasound sternotomy [place]/Referrals: Normal Fetal Ultrasounds or other Referrals:  None Maternal Substance Abuse:  No Significant Maternal Medications:  None Significant Maternal Lab Results:  Lab values include: Group B Strep negative Other Comments:  None  Review of Systems  Constitutional: Negative.   HENT:       Yesterday with " white spots "which resolved spontaneously. The patient had a history of migraine headaches, but this was nowhere near that severe. She has had no further headaches or visual changes   Eyes: Negative for blurred vision, double vision and  photophobia.  Respiratory: Negative.   Cardiovascular: Negative for chest pain and palpitations.  Gastrointestinal: Negative.  Negative for abdominal pain.  Genitourinary: Negative.   Musculoskeletal: Negative.   Skin: Negative.   Neurological: Positive for headaches.  Endo/Heme/Allergies: Negative.     Dilation: Fingertip Effacement (%): 40 Station: -3 Exam by:: Joana Reamer, RN Blood pressure 132/77, pulse 83, temperature 98.2 F (36.8 C), temperature source Oral, resp. rate 20,  height 5\' 5"  (1.651 m), weight 191 lb (86.637 kg), last menstrual period 09/04/2012. Blood pressures ranged on presentation until admission to birthing suite between 133-154/93-107.  Maternal Exam:  Uterine Assessment: Contraction strength is mild.  Abdomen: Patient reports no abdominal tenderness. Surgical scars: low transverse.   Fundal height is 39.   Fetal presentation: vertex  Introitus: Normal vulva. Vulva is negative for lesion.  Normal vagina.  Vagina is negative for ulcerations.    Physical Exam  Constitutional: She is oriented to person, place, and time. She appears well-developed and well-nourished.  HENT:  Head: Normocephalic and atraumatic.  Eyes: Conjunctivae and EOM are normal.  Neck: Normal range of motion. Neck supple.  Cardiovascular: Normal rate, regular rhythm and normal heart sounds.   No murmur heard. Respiratory: Effort normal and breath sounds normal.  GI: Soft.  Gravid  Genitourinary: Vagina normal. Vulva exhibits no lesion.  Musculoskeletal: Normal range of motion.  Neurological: She is alert and oriented to person, place, and time. She has normal reflexes.  Skin: Skin is warm and dry.  Psychiatric: She has a normal mood and affect.     Results for orders placed during the hospital encounter of 06/12/13 (from the past 24 hour(s))  OB RESULTS CONSOLE HEPATITIS B SURFACE ANTIGEN     Status: None   Collection Time    06/12/13 12:00 AM      Result Value Range   Hepatitis B Surface Ag Negative    OB RESULTS CONSOLE ANTIBODY SCREEN     Status: None   Collection Time    06/12/13 12:00 AM      Result Value Range   Antibody Screen Negative    OB RESULTS CONSOLE RUBELLA ANTIBODY, IGM     Status: None   Collection Time    06/12/13 12:00 AM      Result Value Range   Rubella Immune    AMNISURE RUPTURE OF MEMBRANE (ROM)     Status: None   Collection Time    06/12/13  8:30 AM      Result Value Range   Amnisure ROM NEGATIVE    URINALYSIS, ROUTINE W REFLEX  MICROSCOPIC     Status: None   Collection Time    06/12/13  8:33 AM      Result Value Range   Color, Urine YELLOW  YELLOW   APPearance CLEAR  CLEAR   Specific Gravity, Urine 1.015  1.005 - 1.030   pH 7.0  5.0 - 8.0   Glucose, UA NEGATIVE  NEGATIVE mg/dL   Hgb urine dipstick NEGATIVE  NEGATIVE   Bilirubin Urine NEGATIVE  NEGATIVE   Ketones, ur NEGATIVE  NEGATIVE mg/dL   Protein, ur NEGATIVE  NEGATIVE mg/dL   Urobilinogen, UA 0.2  0.0 - 1.0 mg/dL   Nitrite NEGATIVE  NEGATIVE   Leukocytes, UA NEGATIVE  NEGATIVE  PROTEIN / CREATININE RATIO, URINE     Status: None   Collection Time    06/12/13  8:33 AM      Result Value Range   Creatinine, Urine 74.15  Total Protein, Urine 6.6     PROTEIN CREATININE RATIO 0.09  0.00 - 0.15  CBC     Status: Abnormal   Collection Time    06/12/13  8:55 AM      Result Value Range   WBC 9.4  4.0 - 10.5 K/uL   RBC 3.97  3.87 - 5.11 MIL/uL   Hemoglobin 11.2 (*) 12.0 - 15.0 g/dL   HCT 29.5 (*) 28.4 - 13.2 %   MCV 84.6  78.0 - 100.0 fL   MCH 28.2  26.0 - 34.0 pg   MCHC 33.3  30.0 - 36.0 g/dL   RDW 44.0  10.2 - 72.5 %   Platelets 246  150 - 400 K/uL  COMPREHENSIVE METABOLIC PANEL     Status: Abnormal   Collection Time    06/12/13  8:55 AM      Result Value Range   Sodium 133 (*) 135 - 145 mEq/L   Potassium 4.5  3.5 - 5.1 mEq/L   Chloride 102  96 - 112 mEq/L   CO2 24  19 - 32 mEq/L   Glucose, Bld 85  70 - 99 mg/dL   BUN 10  6 - 23 mg/dL   Creatinine, Ser 3.66  0.50 - 1.10 mg/dL   Calcium 9.5  8.4 - 44.0 mg/dL   Total Protein 7.0  6.0 - 8.3 g/dL   Albumin 2.6 (*) 3.5 - 5.2 g/dL   AST 15  0 - 37 U/L   ALT 11  0 - 35 U/L   Alkaline Phosphatase 133 (*) 39 - 117 U/L   Total Bilirubin 0.1 (*) 0.3 - 1.2 mg/dL   GFR calc non Af Amer >90  >90 mL/min   GFR calc Af Amer >90  >90 mL/min  URIC ACID     Status: None   Collection Time    06/12/13  8:55 AM      Result Value Range   Uric Acid, Serum 4.8  2.4 - 7.0 mg/dL  TYPE AND SCREEN     Status:  None   Collection Time    06/12/13  8:55 AM      Result Value Range   ABO/RH(D) O POS     Antibody Screen NEG     Sample Expiration 06/15/2013    ABO/RH     Status: None   Collection Time    06/12/13 11:15 AM      Result Value Range   ABO/RH(D) O POS      FHR TRACING:  Cat 1  Prenatal labs: ABO, Rh: --/--/O POS (10/27 1115) Antibody: NEG (10/27 0855) Rubella: Immune (10/27 0000) RPR: NON REAC (03/10 1549)  HBsAg: Negative (10/27 0000)  HIV: NON REACTIVE (03/10 1549)  GBS: Negative (10/02 0000)   Assessment/Plan: Pregnancy at 40 weeks and 1 day Gestational hypertension with neg pre-eclampsia workup Hx of prior c/s Desire for TOLAC Prodromal contractions without cervical change No evidence of ROM 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    PROCEDURE: With the  pt in lithotomy position inspection of the vulva was undertaken.  A graves speculum was placed and inspection of the vagina and cervix revealed no lesions.  A 60 cc latex free foley was threaded into the cervical canal after it had been cleansed with Betadine.  The foley bulb was filled with 60 cc ns.  The patient tolerated the procedure well and the FHR remained reactive and reassuring.     HAYGOOD,VANESSA P 06/12/2013, 3:37 PM

## 2013-06-12 NOTE — MAU Note (Signed)
Pt states here for ?lof, saw clear fluid after voiding when standing in the shower. Denies lof at present, was not wearing pad when she came in to MAU.

## 2013-06-12 NOTE — Progress Notes (Signed)
Dr. Pennie Rushing notified of pt's lab results, bp still elevated, pt is ctxing, MD to come speak to pt about plan of care.

## 2013-06-12 NOTE — Progress Notes (Signed)
Sarah Duran is a 27 y.o. G3P1011 at [redacted]w[redacted]d by ultrasound admitted for augmentation of labor for gestational hypertension  Subjective: Rating contractions at a 7 since placement of transcervical foley.  Denies HA or upper abdominal pain   Objective: BP 146/103  Pulse 91  Temp(Src) 98.2 F (36.8 C) (Oral)  Resp 20  Ht 5\' 5"  (1.651 m)  Wt 191 lb (86.637 kg)  BMI 31.78 kg/m2  LMP 09/04/2012      FHT:  FHR: 140s bpm, variability: moderate,  accelerations:  Present,  decelerations:  Absent UC:   irregular, every 3-4 minutes SVE:   Dilation: Fingertip Effacement (%): 40 Station: -3 Exam by:: Joana Reamer, RN  Labs: Lab Results  Component Value Date   WBC 9.4 06/12/2013   HGB 11.2* 06/12/2013   HCT 33.6* 06/12/2013   MCV 84.6 06/12/2013   PLT 246 06/12/2013    Assessment / Plan: Early labor with improved contractions after foley  Labor: cervical ripening in process Preeclampsia:  elevated blood pressures with normal PIH labs.  Gestational hypertension.  Will start po labetaloll Fetal Wellbeing:  Category I Pain Control:  no need at this point I/D:  n/a Anticipated MOD:  Pt desires VBAC.  Will plan cervical ripening with foley for max of 12 hrs or until bulb falls out, then begin pitocin  Thais Silberstein P 06/12/2013, 1:53 PM

## 2013-06-13 ENCOUNTER — Encounter (HOSPITAL_COMMUNITY): Payer: Medicaid Other | Admitting: Anesthesiology

## 2013-06-13 ENCOUNTER — Inpatient Hospital Stay (HOSPITAL_COMMUNITY): Payer: Medicaid Other | Admitting: Anesthesiology

## 2013-06-13 LAB — COMPREHENSIVE METABOLIC PANEL
ALT: 9 U/L (ref 0–35)
Albumin: 2.5 g/dL — ABNORMAL LOW (ref 3.5–5.2)
Alkaline Phosphatase: 145 U/L — ABNORMAL HIGH (ref 39–117)
BUN: 7 mg/dL (ref 6–23)
Chloride: 102 mEq/L (ref 96–112)
GFR calc Af Amer: >90 mL/min (ref >90)
Glucose, Bld: 85 mg/dL (ref 70–99)
Potassium: 4.1 mEq/L (ref 3.5–5.1)
Sodium: 134 mEq/L — ABNORMAL LOW (ref 135–145)
Total Bilirubin: 0.3 mg/dL (ref 0.3–1.2)

## 2013-06-13 LAB — CBC
HCT: 32.4 % — ABNORMAL LOW (ref 36.0–46.0)
HCT: 33.4 % — ABNORMAL LOW (ref 36.0–46.0)
Hemoglobin: 11.1 g/dL — ABNORMAL LOW (ref 12.0–15.0)
Hemoglobin: 11.3 g/dL — ABNORMAL LOW (ref 12.0–15.0)
MCH: 28.2 pg (ref 26.0–34.0)
MCHC: 34.3 g/dL (ref 30.0–36.0)
MCHC: 33.8 g/dL (ref 30.0–36.0)
MCV: 83.3 fL (ref 78.0–100.0)
Platelets: 227 10*3/uL (ref 150–400)
Platelets: 233 10*3/uL (ref 150–400)
RBC: 3.89 MIL/uL (ref 3.87–5.11)
RDW: 14.4 % (ref 11.5–15.5)
WBC: 12 10*3/uL — ABNORMAL HIGH (ref 4.0–10.5)

## 2013-06-13 LAB — PROTEIN / CREATININE RATIO, URINE
Creatinine, Urine: 87.88 mg/dL
Protein Creatinine Ratio: 1.07 — ABNORMAL HIGH (ref 0.00–0.15)
Total Protein, Urine: 93.9 mg/dL

## 2013-06-13 LAB — URIC ACID: Uric Acid, Serum: 5.2 mg/dL (ref 2.4–7.0)

## 2013-06-13 MED ORDER — PHENYLEPHRINE 40 MCG/ML (10ML) SYRINGE FOR IV PUSH (FOR BLOOD PRESSURE SUPPORT)
80.0000 ug | PREFILLED_SYRINGE | INTRAVENOUS | Status: DC | PRN
Start: 2013-06-13 — End: 2013-06-14
  Filled 2013-06-13: qty 10

## 2013-06-13 MED ORDER — EPHEDRINE 5 MG/ML INJ
10.0000 mg | INTRAVENOUS | Status: DC | PRN
  Filled 2013-06-13: qty 4

## 2013-06-13 MED ORDER — MAGNESIUM SULFATE 40 MG/ML IJ SOLN: 4.0000 g | Freq: Once | INTRAMUSCULAR | Status: DC

## 2013-06-13 MED ORDER — LACTATED RINGERS IV SOLN
INTRAVENOUS | Status: DC
  Administered 2013-06-13 (×4): via INTRAVENOUS

## 2013-06-13 MED ORDER — LABETALOL HCL 100 MG PO TABS
100.0000 mg | ORAL_TABLET | Freq: Two times a day (BID) | ORAL | Status: DC
  Administered 2013-06-13: 100 mg via ORAL
  Filled 2013-06-13 (×3): qty 1

## 2013-06-13 MED ORDER — MAGNESIUM SULFATE 40 G IN LACTATED RINGERS - SIMPLE
2.0000 g/h | INTRAVENOUS | Status: DC
  Administered 2013-06-14: 2 g/h via INTRAVENOUS
  Filled 2013-06-13 (×2): qty 500

## 2013-06-13 MED ORDER — LACTATED RINGERS IV SOLN: 500.0000 mL | Freq: Once | INTRAVENOUS | Status: DC

## 2013-06-13 MED ORDER — PHENYLEPHRINE 40 MCG/ML (10ML) SYRINGE FOR IV PUSH (FOR BLOOD PRESSURE SUPPORT)
80.0000 ug | PREFILLED_SYRINGE | INTRAVENOUS | Status: DC | PRN
  Filled 2013-06-13: qty 10

## 2013-06-13 MED ORDER — FENTANYL 2.5 MCG/ML BUPIVACAINE 1/10 % EPIDURAL INFUSION (WH - ANES)
INTRAMUSCULAR | Status: DC | PRN
  Administered 2013-06-13: 14 mL/h via EPIDURAL

## 2013-06-13 MED ORDER — MAGNESIUM SULFATE BOLUS VIA INFUSION
4.0000 g | Freq: Once | INTRAVENOUS | Status: AC
  Administered 2013-06-13: 4 g via INTRAVENOUS
  Filled 2013-06-13: qty 500

## 2013-06-13 MED ORDER — FENTANYL 2.5 MCG/ML BUPIVACAINE 1/10 % EPIDURAL INFUSION (WH - ANES)
14.0000 mL/h | INTRAMUSCULAR | Status: DC | PRN
  Administered 2013-06-13 – 2013-06-14 (×2): 14 mL/h via EPIDURAL
  Filled 2013-06-13 (×4): qty 125

## 2013-06-13 MED ORDER — DIPHENHYDRAMINE HCL 50 MG/ML IJ SOLN: 12.5000 mg | INTRAMUSCULAR | Status: DC | PRN

## 2013-06-13 MED ORDER — LIDOCAINE HCL (PF) 1 % IJ SOLN
INTRAMUSCULAR | Status: DC | PRN
  Administered 2013-06-13 (×2): 9 mL

## 2013-06-13 MED ORDER — MAGNESIUM SULFATE 40 G IN LACTATED RINGERS - SIMPLE: 2.0000 g/h | INTRAVENOUS | Status: DC

## 2013-06-13 NOTE — Anesthesia Procedure Notes (Signed)
Epidural Patient location during procedure: OB Start time: 06/13/2013 11:18 AM End time: 06/13/2013 11:22 AM  Staffing Anesthesiologist: Leilani Able Performed by: anesthesiologist   Preanesthetic Checklist Completed: patient identified, surgical consent, pre-op evaluation, timeout performed, IV checked, risks and benefits discussed and monitors and equipment checked  Epidural Patient position: sitting Prep: site prepped and draped and DuraPrep Patient monitoring: continuous pulse ox and blood pressure Approach: midline Injection technique: LOR air  Needle:  Needle type: Tuohy  Needle gauge: 17 G Needle length: 9 cm and 9 Needle insertion depth: 6 cm Catheter type: closed end flexible Catheter size: 19 Gauge Catheter at skin depth: 11 cm Test dose: negative and Other  Assessment Sensory level: T9 Events: blood not aspirated, injection not painful, no injection resistance, negative IV test and no paresthesia  Additional Notes Reason for block:procedure for pain

## 2013-06-13 NOTE — Progress Notes (Signed)
Sarah Duran is a 27 y.o. G3P1011 at [redacted]w[redacted]d   Subjective: Feels pressure  Objective: BP 141/83  Pulse 95  Temp(Src) 99.6 F (37.6 C) (Oral)  Resp 18  Ht 5\' 5"  (1.651 m)  Wt 86.637 kg (191 lb)  BMI 31.78 kg/m2  SpO2 97%  LMP 09/04/2012 I/O last 3 completed shifts: In: -  Out: 600 [Urine:600]    FHT:  FHR: 130 bpm, variability: moderate,  accelerations:  Present,  decelerations:  Absent UC:   regular, every 2-4 minutes SVE:   Dilation: 3.5 Effacement (%): 90;100 Station: -2 Exam by:: Dr Su Hilt  IUPC placed  Labs: Lab Results  Component Value Date   WBC 12.0* 06/13/2013   HGB 11.3* 06/13/2013   HCT 33.4* 06/13/2013   MCV 83.3 06/13/2013   PLT 233 06/13/2013    Assessment / Plan: Augmentation of labor in early phase  Labor: IUPC placed to titrate pitocin to MVUs 180-200 Preeclampsia:  BPs a little elevated.  Will repeat gest htn labs and urine PCR. Fetal Wellbeing:  Category I Pain Control:  Epidural I/D:  GBS neg Anticipated MOD:  NSVD/VBAC  Sarah Duran Y 06/13/2013, 9:14 PM

## 2013-06-13 NOTE — Progress Notes (Signed)
Sarah Duran is a 27 y.o. G3P1011 at [redacted]w[redacted]d Subjective:   Objective: BP 141/83  Pulse 95  Temp(Src) 99.6 F (37.6 C) (Oral)  Resp 18  Ht 5\' 5"  (1.651 m)  Wt 86.637 kg (191 lb)  BMI 31.78 kg/m2  SpO2 97%  LMP 09/04/2012 I/O last 3 completed shifts: In: -  Out: 600 [Urine:600]    FHT:  FHR: 130 bpm, variability: moderate,  accelerations:  Present,  decelerations:  Absent UC:   regular, every 2-4 minutes SVE:   Dilation: 3.5 Effacement (%): 90;100 Station: -2 Exam by:: Dr Su Hilt  AROM with lt mec  Labs: Lab Results  Component Value Date   WBC 12.0* 06/13/2013   HGB 11.3* 06/13/2013   HCT 33.4* 06/13/2013   MCV 83.3 06/13/2013   PLT 233 06/13/2013    Assessment / Plan: Augmentation of labor in early phase  Labor: cont to titrate pitocin per protocol Preeclampsia:  no signs or symptoms of toxicity Fetal Wellbeing:  Category I Pain Control:  Epidural I/D:  GBS neg Anticipated MOD:  NSVD/VBAC  Sarah Duran Y 06/13/2013, 9:12 PM

## 2013-06-13 NOTE — Anesthesia Preprocedure Evaluation (Addendum)
Anesthesia Evaluation  Patient identified by MRN, date of birth, ID band Patient awake    Reviewed: Allergy & Precautions, H&P , NPO status , Patient's Chart, lab work & pertinent test results  Airway Mallampati: II TM Distance: >3 FB Neck ROM: full    Dental no notable dental hx.    Pulmonary neg pulmonary ROS,    Pulmonary exam normal       Cardiovascular hypertension, negative cardio ROS  + Valvular Problems/Murmurs     Neuro/Psych negative neurological ROS  negative psych ROS   GI/Hepatic negative GI ROS, Neg liver ROS,   Endo/Other  negative endocrine ROS  Renal/GU negative Renal ROS     Musculoskeletal   Abdominal Normal abdominal exam  (+)   Peds  Hematology negative hematology ROS (+) anemia ,   Anesthesia Other Findings   Reproductive/Obstetrics (+) Pregnancy                          Anesthesia Physical Anesthesia Plan  ASA: III  Anesthesia Plan: Epidural   Post-op Pain Management:    Induction:   Airway Management Planned:   Additional Equipment:   Intra-op Plan:   Post-operative Plan:   Informed Consent: I have reviewed the patients History and Physical, chart, labs and discussed the procedure including the risks, benefits and alternatives for the proposed anesthesia with the patient or authorized representative who has indicated his/her understanding and acceptance.     Plan Discussed with:   Anesthesia Plan Comments:         Anesthesia Quick Evaluation

## 2013-06-13 NOTE — Progress Notes (Signed)
Patient ID: Sarah Duran, female   DOB: [birth year], 27 y.o.   MRN: [id number] Sarah Duran is a 27 y.o. G3P1011 at [redacted]w[redacted]d admitted for IOL for GHTN, PD   Subjective: Has been sleeping, since nubain/phenergan at about 230am, left undisturbed   Objective: BP 112/78  Pulse 88  Temp(Src) 98.1 F (36.7 C) (Oral)  Resp 18  Ht 5\' 5"  (1.651 m)  Wt 191 lb (86.637 kg)  BMI 31.78 kg/m2  LMP 09/04/2012     FHT:  Cat 1 UC:   toco irreg 2-4   SVE:   Dilation: 2 Effacement (%): 80 Station: -2 Exam by:: Virdia Ziesmer CNM  Deferred at present  Assessment / Plan:  Labor: induction in progress, rcv'd low dose pitocin overnight Preeclampsia:  BP normal Fetal Wellbeing:  Category I Pain Control:  IV meds, planning epidural  Anticipated MOD:  NSVD  GBS neg Will begin titration of pitocin per protocol  TOLAC GHTN, on PO labetalol    Update physician PRN   Malissa Hippo 06/13/2013, 6:26 AM

## 2013-06-13 NOTE — Progress Notes (Signed)
Patient ID: Sarah Duran, female   DOB: 1985/12/14, 27 y.o.   MRN: [id number] Sarah Duran is a 27 y.o. G3P1011 at [redacted]w[redacted]d admitted for IOL, secondary to [facility]  Subjective: Talks through ctx, denies need for pain meds, plans epidural when necessary   Objective: BP 110/68  Pulse 80  Temp(Src) 98 F (36.7 C) (Oral)  Resp 20  Ht 5\' 5"  (1.651 m)  Wt 191 lb (86.637 kg)  BMI 31.78 kg/m2  LMP 09/04/2012     FHT:  Cat 1  UC:   irregular, every 5-7 minutes SVE:   Dilation: 2 Effacement (%): 80 Station: -2 Exam by:: Hillel Card CNM  Foley bulb removed easily  Assessment / Plan:   Labor: ripening complete w foley bulb x12 hours  Preeclampsia:  BP normal, PIH labs normal,  Fetal Wellbeing:  Category I Pain Control:  pt declines at present Anticipated MOD:  TOLAC  TOLAC GHTN , rcv'd PO labetalol  Will run low dose pitocin overnight, and titrate in the AM ambien for sleep Epidural PRN GBS neg  Update physician PRN   Malissa Hippo 06/13/2013, 12:33 AM

## 2013-06-13 NOTE — Progress Notes (Addendum)
Sarah Duran is a 27 y.o. G3P1011 at [redacted]w[redacted]d  Subjective: Resting comfortably s/p epidural.  No complaints.  Objective: BP 118/66  Pulse 79  Temp(Src) 98.4 F (36.9 C) (Oral)  Resp 16  Ht 5\' 5"  (1.651 m)  Wt 86.637 kg (191 lb)  BMI 31.78 kg/m2  SpO2 97%  LMP 09/04/2012   Total I/O In: -  Out: 300 [Urine:300]  FHT:  FHR: 130s bpm, variability: moderate,  accelerations:  Abscent,  decelerations:  Absent UC:   regular, every 2-4 minutes SVE:   Dilation: 2 Effacement (%): 90 Station: -2 Exam by:: Sarah Gins, RN  Labs: Lab Results  Component Value Date   WBC 12.4* 06/13/2013   HGB 11.1* 06/13/2013   HCT 32.4* 06/13/2013   MCV 83.3 06/13/2013   PLT 227 06/13/2013    Assessment / Plan: Augmentation of labor  Labor: Cont to titrate pitocin per protocol Preeclampsia:  no signs or symptoms of toxicity Fetal Wellbeing:  Category I Pain Control:  Epidural I/D:  GBS neg Anticipated MOD:  NSVD  Sarah Duran Y 06/13/2013, 1:39 PM

## 2013-06-13 NOTE — Progress Notes (Signed)
Patient ID: Sarah Duran, female   DOB: [birth year], 27 y.o.   MRN: [id number] Sarah Duran is a 27 y.o. G3P1011 at [redacted]w[redacted]d admitted for IOL for GHTN, now w pre-eclampsia   Subjective: C/o pressure in her bottom, overall good pain relief w epidural   Objective: BP 140/83  Pulse 95  Temp(Src) 100 F (37.8 C) (Oral)  Resp 18  Ht 5\' 5"  (1.651 m)  Wt 191 lb (86.637 kg)  BMI 31.78 kg/m2  SpO2 97%  LMP 09/04/2012     Filed Vitals:   06/13/13 2030 06/13/13 2100 06/13/13 2130 06/13/13 2156  BP: 128/76 141/83 140/83   Pulse: 92 95 95   Temp:  99.6 F (37.6 C)  100 F (37.8 C)  TempSrc:  Oral  Oral  Resp: 18 18 18    Height:      Weight:      SpO2:       I/O last 3 completed shifts: In: -  Out: 600 [Urine:600]        FHT:  Cat 2 UC:   toco q 2-4, coupling at times   SVE:   Dilation: 3.5 Effacement (%): 90;100 Station: -2 Exam by:: S Derotha Fishbaugh, CNM  vtx LOT No scalp stim noted  MVU's about 250  Assessment / Plan:  Labor: prolonged latent/early phase no cervical change since about 7pm despite adequate MVU's, although ctx pattern was very irregular earlier w periods of rest and periods of tachysystole, now somewhat improved w occ periods of coupling  Preeclampsia:  PIH labs normal, except elevated PCR now,  Fetal Wellbeing:  Category II Pain Control:  Epidural Anticipated MOD:  TOLAC, MOD undetermined  GBS neg, mild temp noted, will give PO tylenol  Pitocin decreased in half to 10mu at about 730pm due to run of tachysystole,  Will decrease again to 2mu, to keep MVU's closer to 200 per discussion w Dr Su Hilt BP's have been stable, but now superimposed pre-eclampsia, will begin mag sulfate 4g bolus and 2g/hour  Offered pt to proceed w repeat c/section now, pt desires to wait an additional 1-2 hours for cervical change, she understands will recommend c/s if FHR becomes non-reassuring   CTO closely   Discussed w Dr Imagene Gurney 06/13/2013, 9:58  PM

## 2013-06-14 ENCOUNTER — Encounter (HOSPITAL_COMMUNITY): Payer: Self-pay | Admitting: Anesthesiology

## 2013-06-14 ENCOUNTER — Encounter (HOSPITAL_COMMUNITY)
Admission: AD | Disposition: A | Discharge status: Home / Self Care | Payer: Self-pay | Source: Ambulatory Visit | Attending: Obstetrics and Gynecology

## 2013-06-14 LAB — CBC
HCT: 33.1 % — ABNORMAL LOW (ref 36.0–46.0)
HCT: 24.6 % — ABNORMAL LOW (ref 36.0–46.0)
Hemoglobin: 11.3 g/dL — ABNORMAL LOW (ref 12.0–15.0)
Hemoglobin: 8.5 g/dL — ABNORMAL LOW (ref 12.0–15.0)
MCH: 28.4 pg (ref 26.0–34.0)
MCV: 83.2 fL (ref 78.0–100.0)
Platelets: 240 10*3/uL (ref 150–400)
RBC: 3.98 MIL/uL (ref 3.87–5.11)
RBC: 2.97 MIL/uL — ABNORMAL LOW (ref 3.87–5.11)
RDW: 14.3 % (ref 11.5–15.5)
WBC: 19.3 10*3/uL — ABNORMAL HIGH (ref 4.0–10.5)
WBC: 19.3 10*3/uL — ABNORMAL HIGH (ref 4.0–10.5)

## 2013-06-14 LAB — MAGNESIUM: Magnesium: 5.4 mg/dL — ABNORMAL HIGH (ref 1.5–2.5)

## 2013-06-14 LAB — COMPREHENSIVE METABOLIC PANEL
ALT: 8 U/L (ref 0–35)
Alkaline Phosphatase: 117 U/L (ref 39–117)
BUN: 9 mg/dL (ref 6–23)
CO2: 22 mEq/L (ref 19–32)
Chloride: 103 mEq/L (ref 96–112)
Creatinine, Ser: 0.8 mg/dL (ref 0.50–1.10)
GFR calc Af Amer: >90 mL/min (ref >90)
GFR calc non Af Amer: >90 mL/min (ref >90)
Glucose, Bld: 147 mg/dL — ABNORMAL HIGH (ref 70–99)
Potassium: 4 mEq/L (ref 3.5–5.1)
Sodium: 134 mEq/L — ABNORMAL LOW (ref 135–145)
Total Bilirubin: 0.4 mg/dL (ref 0.3–1.2)

## 2013-06-14 SURGERY — Surgical Case
Anesthesia: Epidural | Site: Abdomen | Wound class: Clean Contaminated

## 2013-06-14 MED ORDER — TETANUS-DIPHTH-ACELL PERTUSSIS 5-2.5-18.5 LF-MCG/0.5 IM SUSP
0.5000 mL | Freq: Once | INTRAMUSCULAR | Status: DC
  Filled 2013-06-14: qty 0.5

## 2013-06-14 MED ORDER — PHENYLEPHRINE HCL 10 MG/ML IJ SOLN
INTRAMUSCULAR | Status: DC | PRN
  Administered 2013-06-14: 80 ug via INTRAVENOUS

## 2013-06-14 MED ORDER — DIPHENHYDRAMINE HCL 25 MG PO CAPS: 25.0000 mg | ORAL_CAPSULE | Freq: Four times a day (QID) | ORAL | Status: DC | PRN

## 2013-06-14 MED ORDER — METOCLOPRAMIDE HCL 5 MG/ML IJ SOLN: 10.0000 mg | Freq: Three times a day (TID) | INTRAMUSCULAR | Status: DC | PRN

## 2013-06-14 MED ORDER — KETOROLAC TROMETHAMINE 60 MG/2ML IM SOLN
INTRAMUSCULAR | Status: AC
  Administered 2013-06-14: 60 mg
  Filled 2013-06-14: qty 2

## 2013-06-14 MED ORDER — FENTANYL CITRATE 0.05 MG/ML IJ SOLN
INTRAMUSCULAR | Status: DC | PRN
  Administered 2013-06-14: 50 ug via INTRAVENOUS

## 2013-06-14 MED ORDER — 0.9 % SODIUM CHLORIDE (POUR BTL) OPTIME
TOPICAL | Status: DC | PRN
  Administered 2013-06-14: 1000 mL

## 2013-06-14 MED ORDER — LANOLIN HYDROUS EX OINT: 1.0000 "application " | TOPICAL_OINTMENT | CUTANEOUS | Status: DC | PRN

## 2013-06-14 MED ORDER — LACTATED RINGERS IV SOLN
INTRAVENOUS | Status: DC | PRN
  Administered 2013-06-14: 06:00:00 via INTRAVENOUS

## 2013-06-14 MED ORDER — KETOROLAC TROMETHAMINE 60 MG/2ML IM SOLN: 60.0000 mg | Freq: Once | INTRAMUSCULAR | Status: AC | PRN

## 2013-06-14 MED ORDER — DIPHENHYDRAMINE HCL 50 MG/ML IJ SOLN: 25.0000 mg | INTRAMUSCULAR | Status: DC | PRN

## 2013-06-14 MED ORDER — DIPHENHYDRAMINE HCL 50 MG/ML IJ SOLN: 12.5000 mg | INTRAMUSCULAR | Status: DC | PRN

## 2013-06-14 MED ORDER — LIDOCAINE-EPINEPHRINE (PF) 2 %-1:200000 IJ SOLN
INTRAMUSCULAR | Status: AC
  Filled 2013-06-14: qty 20

## 2013-06-14 MED ORDER — LACTATED RINGERS IV SOLN
INTRAVENOUS | Status: DC
  Administered 2013-06-14 – 2013-06-15 (×2): via INTRAVENOUS

## 2013-06-14 MED ORDER — KETOROLAC TROMETHAMINE 30 MG/ML IJ SOLN: 30.0000 mg | Freq: Four times a day (QID) | INTRAMUSCULAR | Status: AC | PRN

## 2013-06-14 MED ORDER — SODIUM BICARBONATE 8.4 % IV SOLN
INTRAVENOUS | Status: AC
  Filled 2013-06-14: qty 50

## 2013-06-14 MED ORDER — NALBUPHINE HCL 10 MG/ML IJ SOLN
5.0000 mg | INTRAMUSCULAR | Status: DC | PRN
  Filled 2013-06-14: qty 1

## 2013-06-14 MED ORDER — ONDANSETRON HCL 4 MG PO TABS: 4.0000 mg | ORAL_TABLET | ORAL | Status: DC | PRN

## 2013-06-14 MED ORDER — SODIUM BICARBONATE 8.4 % IV SOLN
INTRAVENOUS | Status: DC | PRN
  Administered 2013-06-14: 10 mL via EPIDURAL
  Administered 2013-06-14 (×3): 5 mL via EPIDURAL

## 2013-06-14 MED ORDER — SODIUM CHLORIDE 0.9 % IV SOLN
3.0000 g | Freq: Four times a day (QID) | INTRAVENOUS | Status: DC
  Administered 2013-06-14: 3 g via INTRAVENOUS
  Filled 2013-06-14 (×3): qty 3

## 2013-06-14 MED ORDER — SCOPOLAMINE 1 MG/3DAYS TD PT72
MEDICATED_PATCH | TRANSDERMAL | Status: AC
  Administered 2013-06-14: 1.5 mg
  Filled 2013-06-14: qty 1

## 2013-06-14 MED ORDER — MEPERIDINE HCL 25 MG/ML IJ SOLN: 6.2500 mg | INTRAMUSCULAR | Status: DC | PRN

## 2013-06-14 MED ORDER — KETOROLAC TROMETHAMINE 30 MG/ML IJ SOLN: 15.0000 mg | Freq: Once | INTRAMUSCULAR | Status: DC | PRN

## 2013-06-14 MED ORDER — OXYTOCIN 10 UNIT/ML IJ SOLN
INTRAMUSCULAR | Status: DC | PRN
  Administered 2013-06-14: 40 [IU] via INTRAMUSCULAR

## 2013-06-14 MED ORDER — OXYCODONE-ACETAMINOPHEN 5-325 MG PO TABS
1.0000 | ORAL_TABLET | ORAL | Status: DC | PRN
  Administered 2013-06-15 – 2013-06-17 (×5): 1 via ORAL
  Filled 2013-06-14 (×4): qty 1
  Filled 2013-06-14: qty 2

## 2013-06-14 MED ORDER — MORPHINE SULFATE 0.5 MG/ML IJ SOLN
INTRAMUSCULAR | Status: AC
  Filled 2013-06-14: qty 10

## 2013-06-14 MED ORDER — OXYTOCIN 40 UNITS IN LACTATED RINGERS INFUSION - SIMPLE MED: 62.5000 mL/h | INTRAVENOUS | Status: AC

## 2013-06-14 MED ORDER — MENTHOL 3 MG MT LOZG: 1.0000 | LOZENGE | OROMUCOSAL | Status: DC | PRN

## 2013-06-14 MED ORDER — CEFAZOLIN SODIUM-DEXTROSE 2-3 GM-% IV SOLR
2.0000 g | Freq: Once | INTRAVENOUS | Status: AC
  Administered 2013-06-14: 2 g via INTRAVENOUS
  Filled 2013-06-14: qty 50

## 2013-06-14 MED ORDER — SCOPOLAMINE 1 MG/3DAYS TD PT72
1.0000 | MEDICATED_PATCH | Freq: Once | TRANSDERMAL | Status: DC
  Filled 2013-06-14: qty 1

## 2013-06-14 MED ORDER — LACTATED RINGERS IV SOLN
INTRAVENOUS | Status: DC | PRN
  Administered 2013-06-14: 07:00:00 via INTRAVENOUS

## 2013-06-14 MED ORDER — SIMETHICONE 80 MG PO CHEW
80.0000 mg | CHEWABLE_TABLET | Freq: Three times a day (TID) | ORAL | Status: DC
  Administered 2013-06-14 – 2013-06-17 (×12): 80 mg via ORAL
  Filled 2013-06-14 (×10): qty 1

## 2013-06-14 MED ORDER — MORPHINE SULFATE (PF) 0.5 MG/ML IJ SOLN
INTRAMUSCULAR | Status: DC | PRN
  Administered 2013-06-14: 2 mg via INTRAVENOUS
  Administered 2013-06-14: 50 mg via EPIDURAL

## 2013-06-14 MED ORDER — IBUPROFEN 600 MG PO TABS
600.0000 mg | ORAL_TABLET | Freq: Four times a day (QID) | ORAL | Status: DC
  Administered 2013-06-14 – 2013-06-17 (×12): 600 mg via ORAL
  Filled 2013-06-14 (×13): qty 1

## 2013-06-14 MED ORDER — FENTANYL CITRATE 0.05 MG/ML IJ SOLN
INTRAMUSCULAR | Status: AC
  Filled 2013-06-14: qty 2

## 2013-06-14 MED ORDER — ONDANSETRON HCL 4 MG/2ML IJ SOLN
INTRAMUSCULAR | Status: AC
  Filled 2013-06-14: qty 2

## 2013-06-14 MED ORDER — INFLUENZA VAC SPLIT QUAD 0.5 ML IM SUSP
0.5000 mL | INTRAMUSCULAR | Status: AC
  Administered 2013-06-15: 0.5 mL via INTRAMUSCULAR
  Filled 2013-06-14 (×2): qty 0.5

## 2013-06-14 MED ORDER — PRENATAL MULTIVITAMIN CH
1.0000 | ORAL_TABLET | Freq: Every day | ORAL | Status: DC
  Administered 2013-06-14 – 2013-06-17 (×4): 1 via ORAL
  Filled 2013-06-14 (×4): qty 1

## 2013-06-14 MED ORDER — DIBUCAINE 1 % RE OINT: 1.0000 "application " | TOPICAL_OINTMENT | RECTAL | Status: DC | PRN

## 2013-06-14 MED ORDER — SIMETHICONE 80 MG PO CHEW
80.0000 mg | CHEWABLE_TABLET | ORAL | Status: DC
  Administered 2013-06-15: 80 mg via ORAL
  Filled 2013-06-14: qty 1

## 2013-06-14 MED ORDER — ONDANSETRON HCL 4 MG/2ML IJ SOLN
INTRAMUSCULAR | Status: DC | PRN
  Administered 2013-06-14: 4 mg via INTRAVENOUS

## 2013-06-14 MED ORDER — PROMETHAZINE HCL 25 MG/ML IJ SOLN: 6.2500 mg | INTRAMUSCULAR | Status: DC | PRN

## 2013-06-14 MED ORDER — DIPHENHYDRAMINE HCL 25 MG PO CAPS: 25.0000 mg | ORAL_CAPSULE | ORAL | Status: DC | PRN

## 2013-06-14 MED ORDER — PHENYLEPHRINE 40 MCG/ML (10ML) SYRINGE FOR IV PUSH (FOR BLOOD PRESSURE SUPPORT)
PREFILLED_SYRINGE | INTRAVENOUS | Status: AC
  Filled 2013-06-14: qty 5

## 2013-06-14 MED ORDER — SIMETHICONE 80 MG PO CHEW
80.0000 mg | CHEWABLE_TABLET | ORAL | Status: DC | PRN
  Filled 2013-06-14 (×2): qty 1

## 2013-06-14 MED ORDER — ZOLPIDEM TARTRATE 5 MG PO TABS: 5.0000 mg | ORAL_TABLET | Freq: Every evening | ORAL | Status: DC | PRN

## 2013-06-14 MED ORDER — SODIUM CHLORIDE 0.9 % IJ SOLN: 3.0000 mL | INTRAMUSCULAR | Status: DC | PRN

## 2013-06-14 MED ORDER — HYDROMORPHONE HCL PF 1 MG/ML IJ SOLN: 0.2500 mg | INTRAMUSCULAR | Status: DC | PRN

## 2013-06-14 MED ORDER — SENNOSIDES-DOCUSATE SODIUM 8.6-50 MG PO TABS
2.0000 | ORAL_TABLET | ORAL | Status: DC
  Administered 2013-06-15 – 2013-06-16 (×3): 2 via ORAL
  Filled 2013-06-14 (×4): qty 2

## 2013-06-14 MED ORDER — ONDANSETRON HCL 4 MG/2ML IJ SOLN: 4.0000 mg | Freq: Three times a day (TID) | INTRAMUSCULAR | Status: DC | PRN

## 2013-06-14 MED ORDER — PHENYLEPHRINE 8 MG IN D5W 100 ML (0.08MG/ML) PREMIX OPTIME: INJECTION | INTRAVENOUS | Status: DC | PRN

## 2013-06-14 MED ORDER — DEXTROSE 5 % IV SOLN
1.0000 ug/kg/h | INTRAVENOUS | Status: DC | PRN
  Filled 2013-06-14: qty 2

## 2013-06-14 MED ORDER — MORPHINE SULFATE (PF) 0.5 MG/ML IJ SOLN
INTRAMUSCULAR | Status: DC | PRN
  Administered 2013-06-14: 3 mg via EPIDURAL

## 2013-06-14 MED ORDER — NALOXONE HCL 0.4 MG/ML IJ SOLN: 0.4000 mg | INTRAMUSCULAR | Status: DC | PRN

## 2013-06-14 MED ORDER — LABETALOL HCL 100 MG PO TABS
100.0000 mg | ORAL_TABLET | Freq: Two times a day (BID) | ORAL | Status: DC
  Administered 2013-06-16 – 2013-06-17 (×2): 100 mg via ORAL
  Filled 2013-06-14 (×8): qty 1

## 2013-06-14 MED ORDER — ONDANSETRON HCL 4 MG/2ML IJ SOLN: 4.0000 mg | INTRAMUSCULAR | Status: DC | PRN

## 2013-06-14 MED ORDER — OXYTOCIN 10 UNIT/ML IJ SOLN
INTRAMUSCULAR | Status: AC
  Filled 2013-06-14: qty 4

## 2013-06-14 MED ORDER — WITCH HAZEL-GLYCERIN EX PADS: 1.0000 "application " | MEDICATED_PAD | CUTANEOUS | Status: DC | PRN

## 2013-06-14 SURGICAL SUPPLY — 34 items
BENZOIN TINCTURE PRP APPL 2/3 (GAUZE/BANDAGES/DRESSINGS) ×2 IMPLANT
CLAMP CORD UMBIL (MISCELLANEOUS) ×2 IMPLANT
CLOTH BEACON ORANGE TIMEOUT ST (SAFETY) ×2 IMPLANT
CONTAINER PREFILL 10% NBF 15ML (MISCELLANEOUS) IMPLANT
DRAPE LG THREE QUARTER DISP (DRAPES) ×4 IMPLANT
DRSG OPSITE POSTOP 4X10 (GAUZE/BANDAGES/DRESSINGS) ×2 IMPLANT
DURAPREP 26ML APPLICATOR (WOUND CARE) ×2 IMPLANT
ELECT REM PT RETURN 9FT ADLT (ELECTROSURGICAL) ×2
ELECTRODE REM PT RTRN 9FT ADLT (ELECTROSURGICAL) ×1 IMPLANT
EXTRACTOR VACUUM M CUP 4 TUBE (SUCTIONS) IMPLANT
GLOVE BIO SURGEON STRL SZ7.5 (GLOVE) ×2 IMPLANT
GLOVE BIOGEL PI IND STRL 7.5 (GLOVE) ×1 IMPLANT
GLOVE BIOGEL PI INDICATOR 7.5 (GLOVE) ×1
GOWN PREVENTION PLUS XLARGE (GOWN DISPOSABLE) ×2 IMPLANT
GOWN STRL REIN XL XLG (GOWN DISPOSABLE) ×2 IMPLANT
KIT ABG SYR 3ML LUER SLIP (SYRINGE) IMPLANT
NEEDLE HYPO 25X5/8 SAFETYGLIDE (NEEDLE) IMPLANT
NS IRRIG 1000ML POUR BTL (IV SOLUTION) ×2 IMPLANT
PACK C SECTION WH (CUSTOM PROCEDURE TRAY) ×2 IMPLANT
PAD OB MATERNITY 4.3X12.25 (PERSONAL CARE ITEMS) ×2 IMPLANT
RETRACTOR WND ALEXIS 25 LRG (MISCELLANEOUS) ×2 IMPLANT
RTRCTR WOUND ALEXIS 25CM LRG (MISCELLANEOUS) ×4
SPONGE LAP 18X18 X RAY DECT (DISPOSABLE) ×2 IMPLANT
STRIP CLOSURE SKIN 1/2X4 (GAUZE/BANDAGES/DRESSINGS) ×2 IMPLANT
SUT CHROMIC 2 0 CT 1 (SUTURE) ×2 IMPLANT
SUT MNCRL AB 3-0 PS2 27 (SUTURE) ×2 IMPLANT
SUT PLAIN 0 NONE (SUTURE) IMPLANT
SUT PLAIN 2 0 XLH (SUTURE) ×2 IMPLANT
SUT VIC AB 0 CT1 36 (SUTURE) ×2 IMPLANT
SUT VIC AB 0 CTX 36 (SUTURE) ×3
SUT VIC AB 0 CTX36XBRD ANBCTRL (SUTURE) ×3 IMPLANT
TOWEL OR 17X24 6PK STRL BLUE (TOWEL DISPOSABLE) ×2 IMPLANT
TRAY FOLEY CATH 14FR (SET/KITS/TRAYS/PACK) IMPLANT
WATER STERILE IRR 1000ML POUR (IV SOLUTION) IMPLANT

## 2013-06-14 NOTE — Progress Notes (Signed)
Lillard, CNM at bedside, discussing with pt recommendation for repeat c-section due to no cervical change despite adequate MVUs, pt verbalizes understanding and consents, Lillard, CNM to call Dr. Su Hilt

## 2013-06-14 NOTE — Progress Notes (Signed)
Patient ID: Sarah Duran, female   DOB: 1986/03/10, 27 y.o.   MRN: [id number] Sarah Duran is a 27 y.o. G3P1011 at [redacted]w[redacted]d admitted for IOL  Subjective: Has been sleeping   Objective: BP 145/98  Pulse 104  Temp(Src) 99.3 F (37.4 C) (Oral)  Resp 16  Ht 5\' 5"  (1.651 m)  Wt 191 lb (86.637 kg)  BMI 31.78 kg/m2  SpO2 98%  LMP 09/04/2012  Filed Vitals:   06/14/13 0300 06/14/13 0330 06/14/13 0400 06/14/13 0430  BP: 147/95 128/85 135/92 145/98  Pulse: 101 100 98 104  Temp: 99.3 F (37.4 C)     TempSrc: Oral     Resp: 18  16 16   Height:      Weight:      SpO2:         Total I/O In: 3952.1 [P.O.:358; I.V.:3494.1; IV Piggyback:100] Out: 395 [Urine:395]  UO about 50cc/hour   FHT:  Cat 2,  UC:   toco 2-4   SVE:   Dilation: 4 Effacement (%): 90;100 Station: -2 Exam by:: Kodee Ravert, CNM  vtx still OT More caput felt  Pitocin at 10mu MVU's about 210  Assessment / Plan:  Labor: arrest of labor, despite adequate MVU's Preeclampsia:  on magnesium sulfate Fetal Wellbeing:  Category II Pain Control:  Epidural Anticipated MOD:  repeat c/s  GBS neg, low grade temp, rcv'd 1 dose of unasyn at 230am for chorio prophylaxis Pre-eclampsia, BP's mildly increased  Will proceed w cesarean section r/b rv'd including but not limited to: 1. Bleeding 2. Infection 3. Damage to internal organs 4. Anesthesia complications  Pt wishes to proceed w repeat cesarean   Ancef 2g IVPB D/w Dr Evlyn Kanner M 06/14/2013, 5:17 AM

## 2013-06-14 NOTE — Anesthesia Postprocedure Evaluation (Signed)
Anesthesia Post Note  Patient: Ecologist  Procedure(s) Performed: Procedure(s) (LRB): Repeat Cesarean Section Delivery Baby Boy  @  [id number]  , Apgars 8/9 (N/A)  Anesthesia type: Epidural  Patient location: PACU  Post pain: Pain level controlled  Post assessment: Post-op Vital signs reviewed  Last Vitals:  Filed Vitals:   06/14/13 0800  BP: 126/80  Pulse: 98  Temp:   Resp: 16    Post vital signs: Reviewed  Level of consciousness: awake  Complications: No apparent anesthesia complications

## 2013-06-14 NOTE — Op Note (Signed)
Cesarean Section Procedure Note  Indications: FTP and Chorio  Pre-operative Diagnosis: Failure to Progress and Chorio with Preeclampsia   Post-operative Diagnosis: Failure to Progress and Chorio with Preeclampsia  Procedure: CESAREAN SECTION  Surgeon: Osborn Coho, MD    Assistants: Sanda Klein, CNM  Anesthesia: Epidural  Procedure Details  The patient was taken to the operating room secondary to FTP and Chorio with Preeclampsia after the risks, benefits, complications, treatment options, and expected outcomes were discussed with the patient.  The patient concurred with the proposed plan, giving informed consent which was signed and witnessed. The patient was taken to Operating Room C-Section Suite, identified as Ecologist and the procedure verified as C-Section Delivery. A Time Out was held and the above information confirmed.  After induction of anesthesia by obtaining a spinal, the patient was prepped and draped in the usual sterile manner. A Pfannenstiel skin incision was made and carried down through the subcutaneous tissue to the underlying layer of fascia.  The fascia was incised bilaterally and extended transversely bilaterally with the Mayo scissors. Kocher clamps were placed on the inferior aspect of the fascial incision and the underlying rectus muscle was separated from the fascia. The same was done on the superior aspect of the fascial incision.  Dense scar tissue was noted. The peritoneum was identified, entered bluntly and extended manually. Bladder adhesions extending upward on uterus were excised. An Alexis retractor was placed. The utero-vesical peritoneal reflection was incised transversely and the bladder flap was sharply freed from the lower uterine segment. A low transverse uterine incision was made with the scalpel and extended bilaterally with the bandage scissors.  The infant was delivered in vertex position without difficulty.  After the umbilical cord was  clamped and cut, the infant was handed to the awaiting pediatricians.  Cord blood was obtained for evaluation.  The placenta was removed intact and appeared to be within normal limits. The uterus was cleared of all clots and debris. The uterine incision was closed with running interlocking sutures of 0 Vicryl and a second imbricating layer was performed as well.   Bilateral tubes and ovaries appeared to be within normal limits.  Good hemostasis was noted.  Copious irrigation was performed until clear.  The peritoneum was repaired with 2-0 chromic via a running suture.  The fascia was reapproximated with a running suture of 0 Vicryl. The subcutaneous tissue was reapproximated with 5 interrupted sutures of 2-0 plain.  The skin was reapproximated with a subcuticular suture of 3-0 monocryl.  Steristrips were applied.  Instrument, sponge, and needle counts were correct prior to abdominal closure and at the conclusion of the case.  The patient was awaiting transfer to the recovery room in good condition.  Findings: Live female infant with Apgars 8 at one minute and 9 at five minutes.  Normal appearing bilateral ovaries and fallopian tubes were noted.  Estimated Blood Loss:  1000 ml         Drains: foley to gravity 200 cc         Total IV Fluids: 1200 ml         Specimens to Pathology: Placenta         Complications:  None; patient tolerated the procedure well.         Disposition: PACU - hemodynamically stable.         Condition: stable  Attending Attestation: I performed the procedure.

## 2013-06-14 NOTE — Progress Notes (Signed)
Patient ID: Sarah Duran, female   DOB: August 25, 1985, 27 y.o.   MRN: [id number] Sarah Duran is a 27 y.o. G3P1011 at [redacted]w[redacted]d admitted for IOL   Subjective: C/o low back pain, increased more recently   Objective: BP 141/76  Pulse 114  Temp(Src) 99.8 F (37.7 C) (Oral)  Resp 18  Ht 5\' 5"  (1.651 m)  Wt 191 lb (86.637 kg)  BMI 31.78 kg/m2  SpO2 98%  LMP 09/04/2012  Filed Vitals:   06/14/13 0030 06/14/13 0100 06/14/13 0130 06/14/13 0200  BP: 136/86 135/92 128/71 141/76  Pulse: 104 103 111 114  Temp:  99.8 F (37.7 C)    TempSrc:  Oral    Resp:  16  18  Height:      Weight:      SpO2:         Total I/O In: 3600.9 [P.O.:358; I.V.:3242.9] Out: 170 [Urine:170]  FHT:  Cat 2, +scalp stim UC:   toco 3-4  SVE:   4/100/-2  MVU's around 200, until recently decreased to 160's, pitocin was increased to 6mu at about 0130 vtx still LOT, asynclitic     Assessment / Plan:  Labor: early labor Preeclampsia:  on magnesium sulfate Fetal Wellbeing:  Category II Pain Control:  Epidural Anticipated MOD:  TOLAC, MOD undetermined  GBS neg, will begin unasyn 3g IVPB for chorio prophylaxis, low grade temp noted  Will utilize epidural PCA Continue to titrate pitocin to maintain MVU's around 200  Pt declined c/s at this time,rv'd r/b of continued TOLAC Will recheck around 4am     Update physician PRN   Malissa Hippo 06/14/2013, 2:23 AM

## 2013-06-14 NOTE — Anesthesia Postprocedure Evaluation (Signed)
Anesthesia Post Note  Patient: Sarah Duran  Procedure(s) Performed: Procedure(s) (LRB): Repeat Cesarean Section Delivery Baby Boy  @  [phone]  , Apgars 8/9 (N/A)  Anesthesia type: Epidural  Patient location: Mother/Baby  Post pain: Pain level controlled  Post assessment: Post-op Vital signs reviewed  Last Vitals:  Filed Vitals:   06/14/13 1415  BP: 116/69  Pulse: 80  Temp:   Resp: 16    Post vital signs: Reviewed  Level of consciousness:alert  Complications: No apparent anesthesia complications

## 2013-06-14 NOTE — Transfer of Care (Signed)
Immediate Anesthesia Transfer of Care Note  Patient: Sarah Duran  Procedure(s) Performed: Procedure(s): Repeat Cesarean Section Delivery Baby Boy  @  [phone]  , Apgars 8/9 (N/A)  Patient Location: PACU  Anesthesia Type:Epidural  Level of Consciousness: awake and sedated  Airway & Oxygen Therapy: Patient Spontanous Breathing  Post-op Assessment: Report given to PACU RN  Post vital signs: Reviewed and stable  Complications: No apparent anesthesia complications

## 2013-06-14 NOTE — Progress Notes (Signed)
Dr. Su Hilt at bedside discussing risks and benefits of c-section, pt verbalizes understanding and consents to repeat c-section for failure to progress and chorioaminiotitis. FOB at bedside

## 2013-06-14 NOTE — Progress Notes (Signed)
Pt sleeping soundly VSS FHR cat 1 Will defer exam another hour  [name], CNM

## 2013-06-14 NOTE — Lactation Note (Signed)
This note was copied from the chart of Sarah [employer]. Lactation Consultation Note Initial consultation, mom in AICU, healthy term baby 40-2 weeks.  Mom holding baby STS at this time, baby's temp 96.8, baby sound asleep, no feeding cues. Mom states she breast fed her first child for 6 months, had low supply, was also using formula from early on. Reviewed supply and demand and the importance of avoiding formula and paci unless medically necessary until breastfeeding is well established.  Reviewed breast feeding basics, lactation brochure, community resources, BFSG. Enc mom to continue frequent STS and cue based breast feeding, and to call for assistance if needed.   Patient Name: Sarah Duran Date: 06/14/2013 Reason for consult: Initial assessment   Maternal Data Formula Feeding for Exclusion: Yes Reason for exclusion: Admission to Intensive Care Unit (ICU) post-partum;Mother's choice to formula and breast feed on admission Infant to breast within first hour of birth: Yes Has patient been taught Hand Expression?: Yes Does the patient have breastfeeding experience prior to this delivery?: Yes  Feeding Feeding Type: Breast Fed Length of feed: 0 min  LATCH Score/Interventions Latch: Too sleepy or reluctant, no latch achieved, no sucking elicited. Intervention(s): Skin to skin;Teach feeding cues;Waking techniques  Audible Swallowing: None Intervention(s): Skin to skin;Hand expression  Type of Nipple: Flat  Comfort (Breast/Nipple): Soft / non-tender     Hold (Positioning): Assistance needed to correctly position infant at breast and maintain latch. Intervention(s): Breastfeeding basics reviewed;Support Pillows;Position options;Skin to skin  LATCH Score: 4  Lactation Tools Discussed/Used     Consult Status Consult Status: Follow-up Follow-up type: In-patient    Octavio Manns [facility] 06/14/2013, 1:48 PM

## 2013-06-14 NOTE — Progress Notes (Signed)
Patient ID: Sarah Duran, female   DOB: 01-06-1986, 27 y.o.   MRN: [id number] Sarah Duran is a 27 y.o. G3P1011 at [redacted]w[redacted]d admitted for IOL   Subjective: Sleeping now, denies pain or pressure, continues to deny any PIH sx's   Objective: BP 133/85  Pulse 95  Temp(Src) 100 F (37.8 C) (Oral)  Resp 18  Ht 5\' 5"  (1.651 m)  Wt 191 lb (86.637 kg)  BMI 31.78 kg/m2  SpO2 98%  LMP 09/04/2012  Total I/O In: 3097.8 [P.O.:240; I.V.:2857.8] Out: 170 [Urine:170]  FHT:  Cat 2, occ 10x10 accel, decreased variability, occ periods of mod variability  UC:   toco 3-4  SVE:   Dilation: 3.5 Effacement (%): 90;100 Station: -2 Exam by:: Sarah Duran, CNM  Vag exam deferred MVU's about 200 Pitocin on 4mu   Assessment / Plan:  Labor: early labor Preeclampsia:  on magnesium sulfate Fetal Wellbeing:  Category II Pain Control:  Epidural Anticipated MOD:  TOLAC, method undetermined   GBS neg  BP stable Pt would like to wait 2 more hours for vag exam  Will continue to titrate pitocin PRN for 200 mvu's  Will check cervix about 2am   Updated Dr Su Hilt about 11pm    Sarah Duran M 06/14/2013, 12:04 AM

## 2013-06-15 ENCOUNTER — Encounter (HOSPITAL_COMMUNITY): Payer: Self-pay | Admitting: Obstetrics and Gynecology

## 2013-06-15 LAB — COMPREHENSIVE METABOLIC PANEL
ALT: 7 U/L (ref 0–35)
Alkaline Phosphatase: 101 U/L (ref 39–117)
BUN: 8 mg/dL (ref 6–23)
CO2: 25 mEq/L (ref 19–32)
Chloride: 104 mEq/L (ref 96–112)
GFR calc Af Amer: >90 mL/min (ref >90)
GFR calc non Af Amer: >90 mL/min (ref >90)
Glucose, Bld: 111 mg/dL — ABNORMAL HIGH (ref 70–99)
Potassium: 4.1 mEq/L (ref 3.5–5.1)
Sodium: 134 mEq/L — ABNORMAL LOW (ref 135–145)
Total Bilirubin: 0.2 mg/dL — ABNORMAL LOW (ref 0.3–1.2)

## 2013-06-15 LAB — CBC
HCT: 21.7 % — ABNORMAL LOW (ref 36.0–46.0)
Hemoglobin: 7.4 g/dL — ABNORMAL LOW (ref 12.0–15.0)
MCV: 83.1 fL (ref 78.0–100.0)
RBC: 2.61 MIL/uL — ABNORMAL LOW (ref 3.87–5.11)
RDW: 14.8 % (ref 11.5–15.5)

## 2013-06-15 LAB — LACTATE DEHYDROGENASE: LDH: 203 U/L (ref 94–250)

## 2013-06-15 NOTE — Progress Notes (Signed)
Subjective:   Postpartum Day 1: Cesarean Delivery Patient reports tolerating PO.  No headaches, blurred vision, or RUQ pain.   Objective: Vital signs in last 24 hours: Temp:  [98.1 F (36.7 C)-99.4 F (37.4 C)] 98.5 F (36.9 C) (10/30 0743) Pulse Rate:  [76-92] 84 (10/30 0300) Resp:  [14-20] 18 (10/30 0743) BP: (94-129)/(53-81) 107/59 mmHg (10/30 0800) SpO2:  [92 %-99 %] 97 % (10/30 0300) Weight:  [184 lb 12.8 oz (83.825 kg)-190 lb 11.2 oz (86.501 kg)] 190 lb 11.2 oz (86.501 kg) (10/30 0537)  Physical Exam:  General: no distress Lochia: appropriate Uterine Fundus: firm Incision: healing well DVT Evaluation: No evidence of DVT seen on physical exam.   Recent Labs  06/14/13 1347 06/15/13 0522  HGB 8.5* 7.4*  HCT 24.6* 21.7*    Assessment/Plan: Status post Cesarean section. Doing well postoperatively.  Resolved preeclampsia. Will stop magnesium and transfer to the floor. Possible discharge from the hospital tomorrow.  Sarah Duran V 06/15/2013, 8:44 AM

## 2013-06-15 NOTE — Progress Notes (Signed)
UR chart review completed.  

## 2013-06-16 NOTE — Progress Notes (Addendum)
Patient ID: Sarah Duran, female   DOB: 1985/11/08, 27 y.o.   MRN: [id number] Subjective: Postpartum Day 2: Cesarean Delivery secondary to: failed VBAC, FTP, chorio  Patient reports tolerating PO, + flatus and no problems voiding.   no complaints, up ad lib without syncope Pain well controlled with po meds BF well  Mood stable, bonding well Contraception: undecided   Objective: Vital signs in last 24 hours: Temp:  [98.1 F (36.7 C)-99.1 F (37.3 C)] 98.1 F (36.7 C) (10/31 0621) Pulse Rate:  [78-114] 80 (10/31 0621) Resp:  [18-20] 18 (10/31 0621) BP: (109-124)/(49-82) 123/81 mmHg (10/31 0621) SpO2:  [99 %-100 %] 100 % (10/30 2236)  Physical Exam:  General: alert and no distress Heart: RRR Lungs: CTAB Abdomen: BS x4 Uterine Fundus: firm Incision: healing well  Honeycomb dressing intact  Lochia: appropriate DVT Evaluation: No evidence of DVT seen on physical exam. Negative Homan's sign. No significant calf/ankle edema.   Recent Labs  06/14/13 1347 06/15/13 0522  HGB 8.5* 7.4*  HCT 24.6* 21.7*    Assessment/Plan: Status post Cesarean section. Doing well postoperatively.  S/p mag sulfate for seizure prophylaxis due to elevated pcr, BP's stable  Continue current care. Orthostatic vitals stable last evening  Plan discharge home trmw  Breastfeeding    Rahel Carlton M 06/16/2013, 9:01 AM

## 2013-06-16 NOTE — Lactation Note (Signed)
This note was copied from the chart of Sarah [employer]. Lactation Consultation Note  Patient Name: Sarah Duran [id number] Date: 06/16/2013 Reason for consult: Follow-up assessment;Infant < 6lbs;Infant weight loss of 9 % and formula supplement per MD recommendation, as reported by mom to [facility] tonight.  Mom is always offering breast first and reports that baby has steong/rhythmical sucking for at least 15-30 minutes per feeding.  Mom is offering small amounts of formula supplement after breastfeeding but denies any latching difficulty today.  Baby has had 4 stools and 4 voids since birth which is wnl for this hour of life.     Maternal Data    Feeding Feeding Type: Breast Fed Length of feed: 25 min  LATCH Score/Interventions           LATCH score=8 today, per RN assessment           Lactation Tools Discussed/Used   Cue feedings at breast  Consult Status Consult Status: Follow-up Date: 06/17/13 Follow-up type: In-patient    Warrick Parisian [facility] 06/16/2013, 8:48 PM

## 2013-06-17 DIAGNOSIS — Z98891 History of uterine scar from previous surgery: Secondary | ICD-10-CM

## 2013-06-17 MED ORDER — LABETALOL HCL 100 MG PO TABS: 200.0000 mg | ORAL_TABLET | Freq: Two times a day (BID) | ORAL | Status: DC

## 2013-06-17 MED ORDER — IBUPROFEN 600 MG PO TABS: 600.0000 mg | ORAL_TABLET | Freq: Four times a day (QID) | ORAL | Status: DC | PRN

## 2013-06-17 MED ORDER — LABETALOL HCL 100 MG PO TABS
100.0000 mg | ORAL_TABLET | Freq: Once | ORAL | Status: AC
  Administered 2013-06-17: 100 mg via ORAL
  Filled 2013-06-17: qty 1

## 2013-06-17 MED ORDER — MEDROXYPROGESTERONE ACETATE 150 MG/ML IM SUSP
150.0000 mg | Freq: Once | INTRAMUSCULAR | Status: AC
  Administered 2013-06-17: 150 mg via INTRAMUSCULAR
  Filled 2013-06-17: qty 1

## 2013-06-17 MED ORDER — OXYCODONE-ACETAMINOPHEN 5-325 MG PO TABS: 1.0000 | ORAL_TABLET | ORAL | Status: DC | PRN

## 2013-06-17 NOTE — Discharge Summary (Signed)
Cesarean Section Delivery Discharge Summary  Sarah Duran  DOB:    06/02/1986 MRN:    [id number] CSN:    [id number]  Date of admission:                  06/12/13  Date of discharge:                   06/17/13  Procedures this admission:  Repeat LTCS, Magnesium sulfate x 24 hours after delivery  Date of Delivery: 06/14/13  Newborn Data:  Live born female  Birth Weight: 6 lb 4.2 oz (2840 g) APGAR: 8, 9  Home with mother. Circumcision Plan: Outpatient  History of Present Illness:  [name] is a 27 y.o. female, [id number], who presents at [redacted]w[redacted]d weeks gestation. The patient has been followed at the [facility] and Gynecology division of [facility].    Her pregnancy has been complicated by:  Patient Active Problem List   Diagnosis Date Noted  . Status post repeat low transverse cesarean section 06/17/2013  . First stage of labor problem 06/12/2013  . Gestational hypertension w/o significant proteinuria in 3rd trimester 06/12/2013  . Previous cesarean delivery, antepartum condition or complication 06/12/2013  . 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 Atypical squamous cell changes of undetermined significance favor benign (ASCUS favor benign) 11/10/2012  . Ectopic pregnancy, tubal 02/24/2011    Hospital course:  The patient was admitted for induction of labor due to gestational hypertension and postdates, previous cesarean birth, desire for VBAC.  She did not progress beyond 4 cm, but had elevated BP and became febrile.  Dr. Su Hilt recommended repeat C/S.  Patient was on magnesium sulfate for 24 hours after delivery, then was started on Labetalol 100 mg po BID.   Her postpartum course was complicated. She was discharged to home on postpartum day 2--her BPs still showed some mild elevation, and she was d/c'd home on Labetalol 200 mg po BID.  She is to return to office in 1 week for BP check (she lives in [place]).  Orthostatic BP/pulses are  stable.  Feeding:  breast  Contraception:  Depo-Provera  Discharge hemoglobin:  Hemoglobin  Date Value Range Status  06/15/2013 7.4* 12.0 - 15.0 g/dL Final     HCT  Date Value Range Status  06/15/2013 21.7* 36.0 - 46.0 % Final    Discharge Physical Exam:   General: alert Lochia: appropriate Uterine Fundus: firm Incision: healing well DVT Evaluation: No evidence of DVT seen on physical exam. Negative Homan's sign.  Intrapartum Procedures: cesarean: low cervical, transverse Postpartum Procedures: Magnesium sulfate x 24 hours pp. Complications-Operative and Postpartum: anemia without hemodynamic instability  Discharge Diagnoses: Term Pregnancy-delivered, Preelampsia and anemia  Discharge Information:  Activity:           Per [facility] handout Diet:                routine Medications: Ibuprofen, Percocet and Labetalol 200 mg po BID, DepoProvera 150 mg IM prior to d/c, Fe OTC. Condition:      stable Instructions:  Care After Cesarean Delivery  Refer to this sheet in the next few weeks. These instructions provide you with information on caring for yourself after your procedure. Your caregiver may also give you specific instructions. Your treatment has been planned according to current medical practices, but problems sometimes occur. Call your caregiver if you have any problems or questions after you go home. HOME CARE INSTRUCTIONS  Only take over-the-counter or prescription  medicines as directed by your caregiver.  Do not drink alcohol, especially if you are breastfeeding or taking medicine to relieve pain.  Do not chew or smoke tobacco.  Continue to use good perineal care. Good perineal care includes:  Wiping your perineum from front to back.  Keeping your perineum clean.  Check your cut (incision) daily for increased redness, drainage, swelling, or separation of skin.  Clean your incision gently with soap and water every day, and then pat it dry. If your  caregiver says it is okay, leave the incision uncovered. Use a bandage (dressing) if the incision is draining fluid or appears irritated. If the adhesive strips across the incision do not fall off within 7 days, carefully peel them off.  Hug a pillow when coughing or sneezing until your incision is healed. This helps to relieve pain.  Do not use tampons or douche until your caregiver says it is okay.  Shower, wash your hair, and take tub baths as directed by your caregiver.  Wear a well-fitting bra that provides breast support.  Limit wearing support panties or control-top hose.  Drink enough fluids to keep your urine clear or pale yellow.  Eat high-fiber foods such as whole grain cereals and breads, brown rice, beans, and fresh fruits and vegetables every day. These foods may help prevent or relieve constipation.  Resume activities such as climbing stairs, driving, lifting, exercising, or traveling as directed by your caregiver.  Talk to your caregiver about resuming sexual activities. This is dependent upon your risk of infection, your rate of healing, and your comfort and desire to resume sexual activity.  Try to have someone help you with your household activities and your newborn for at least a few days after you leave the hospital.  Rest as much as possible. Try to rest or take a nap when your newborn is sleeping.  Increase your activities gradually.  Keep all of your scheduled postpartum appointments. It is very important to keep your scheduled follow-up appointments. At these appointments, your caregiver will be checking to make sure that you are healing physically and emotionally. SEEK MEDICAL CARE IF:   You are passing large clots from your vagina. Save any clots to show your caregiver.  You have a foul smelling discharge from your vagina.  You have trouble urinating.  You are urinating frequently.  You have pain when you urinate.  You have a change in your bowel  movements.  You have increasing redness, pain, or swelling near your incision.  You have pus draining from your incision.  Your incision is separating.  You have painful, hard, or reddened breasts.  You have a severe headache.  You have blurred vision or see spots.  You feel sad or depressed.  You have thoughts of hurting yourself or your newborn.  You have questions about your care, the care of your newborn, or medicines.  You are dizzy or lightheaded.  You have a rash.  You have pain, redness, or swelling at the site of the removed intravenous access (IV) tube.  You have nausea or vomiting.  You stopped breastfeeding and have not had a menstrual period within 12 weeks of stopping.  You are not breastfeeding and have not had a menstrual period within 12 weeks of delivery.  You have a fever. SEEK IMMEDIATE MEDICAL CARE IF:  You have persistent pain.  You have chest pain.  You have shortness of breath.  You faint.  You have leg pain.  You have  stomach pain.  Your vaginal bleeding saturates 2 or more sanitary pads in 1 hour. MAKE SURE YOU:   Understand these instructions.  Will watch your condition.  Will get help right away if you are not doing well or get worse. Document Released: 04/25/2002 Document Revised: 04/27/2012 Document Reviewed: 03/30/2012 [facility] Patient Information 2014 [place], [place].   Postpartum Depression and Baby Blues  The postpartum period begins right after the birth of a baby. During this time, there is often a great amount of joy and excitement. It is also a time of considerable changes in the life of the parent(s). Regardless of how many times a mother gives birth, each child brings new challenges and dynamics to the family. It is not unusual to have feelings of excitement accompanied by confusing shifts in moods, emotions, and thoughts. All mothers are at risk of developing postpartum depression or the "baby blues." These mood  changes can occur right after giving birth, or they may occur many months after giving birth. The baby blues or postpartum depression can be mild or severe. Additionally, postpartum depression can resolve rather quickly, or it can be a long-term condition. CAUSES Elevated hormones and their rapid decline are thought to be a main cause of postpartum depression and the baby blues. There are a number of hormones that radically change during and after pregnancy. Estrogen and progesterone usually decrease immediately after delivering your baby. The level of thyroid hormone and various cortisol steroids also rapidly drop. Other factors that play a major role in these changes include major life events and genetics.  RISK FACTORS If you have any of the following risks for the baby blues or postpartum depression, know what symptoms to watch out for during the postpartum period. Risk factors that may increase the likelihood of getting the baby blues or postpartum depression include:  Havinga personal or family history of depression.  Having depression while being pregnant.  Having premenstrual or oral contraceptive-associated mood issues.  Having exceptional life stress.  Having marital conflict.  Lacking a social support network.  Having a baby with special needs.  Having health problems such as diabetes. SYMPTOMS Baby blues symptoms include:  Brief fluctuations in mood, such as going from extreme happiness to sadness.  Decreased concentration.  Difficulty sleeping.  Crying spells, tearfulness.  Irritability.  Anxiety. Postpartum depression symptoms typically begin within the first month after giving birth. These symptoms include:  Difficulty sleeping or excessive sleepiness.  Marked weight loss.  Agitation.  Feelings of worthlessness.  Lack of interest in activity or food. Postpartum psychosis is a very concerning condition and can be dangerous. Fortunately, it is rare.  Displaying any of the following symptoms is cause for immediate medical attention. Postpartum psychosis symptoms include:  Hallucinations and delusions.  Bizarre or disorganized behavior.  Confusion or disorientation. DIAGNOSIS  A diagnosis is made by an evaluation of your symptoms. There are no medical or lab tests that lead to a diagnosis, but there are various questionnaires that a caregiver may use to identify those with the baby blues, postpartum depression, or psychosis. Often times, a screening tool called the [place] Postnatal Depression Scale is used to diagnose depression in the postpartum period.  TREATMENT The baby blues usually goes away on its own in 1 to 2 weeks. Social support is often all that is needed. You should be encouraged to get adequate sleep and rest. Occasionally, you may be given medicines to help you sleep.  Postpartum depression requires treatment as it can last  several months or longer if it is not treated. Treatment may include individual or group therapy, medicine, or both to address any social, physiological, and psychological factors that may play a role in the depression. Regular exercise, a healthy diet, rest, and social support may also be strongly recommended.  Postpartum psychosis is more serious and needs treatment right away. Hospitalization is often needed. HOME CARE INSTRUCTIONS  Get as much rest as you can. Nap when the baby sleeps.  Exercise regularly. Some women find yoga and walking to be beneficial.  Eat a balanced and nourishing diet.  Do little things that you enjoy. Have a cup of tea, take a bubble bath, read your favorite magazine, or listen to your favorite music.  Avoid alcohol.  Ask for help with household chores, cooking, grocery shopping, or running errands as needed. Do not try to do everything.  Talk to people close to you about how you are feeling. Get support from your partner, family members, friends, or other new  moms.  Try to stay positive in how you think. Think about the things you are grateful for.  Do not spend a lot of time alone.  Only take medicine as directed by your caregiver.  Keep all your postpartum appointments.  Let your caregiver know if you have any concerns. SEEK MEDICAL CARE IF: You are having a reaction or problems with your medicine. SEEK IMMEDIATE MEDICAL CARE IF:  You have suicidal feelings.  You feel you may harm the baby or someone else. Document Released: 05/07/2004 Document Revised: 10/26/2011 Document Reviewed: 06/09/2011 [facility] Patient Information 2014 [place], [place].  Discharge to: home  Follow-up Information   Follow up with [facility]. Schedule an appointment as soon as possible for a visit in 1 week. (Office will call you to make time for you to come in for a blood pressure check.  Will also need to make 6 week postpartum visit.)    Specialty:  Obstetrics and Gynecology   Contact information:   [address] [place] [address] [phone]       Nigel Bridgeman 06/17/2013

## 2013-06-17 NOTE — Lactation Note (Signed)
This note was copied from the chart of Sarah [employer]. Lactation Consultation Note Follow up consult with this mom and baby, now 74 hours post partum. Mom has supplemented with formula after breast feeding, twice last night. Mom says the pediatrician wanted her to do so due to 9% weight loss and increasing bili. Mom told me she  And baby prefer breast feeding. On exam, her breast are full with easily expressed transitional breast milk. I gave mom a manual hand pump, and advised her to try and supplement with EBM as opposed to formula, if needed. Mom knows to call lactation for questions/concerns. She is on the list for discharge today, but is having an elevated BP and headache at the present time.   Patient Name: Sarah Duran [id number] Date: 06/17/2013 Reason for consult: Follow-up assessment   Maternal Data    Feeding Feeding Type: Bottle Fed - Formula (offered to assist with latch to breast-pt declined)  LATCH Score/Interventions                      Lactation Tools Discussed/Used Tools: Pump   Consult Status Consult Status: Complete Follow-up type: Call as needed    Alfred Levins 06/17/2013, 9:19 AM

## 2014-06-18 ENCOUNTER — Encounter (HOSPITAL_COMMUNITY): Payer: Self-pay | Admitting: Obstetrics and Gynecology

## 2015-11-11 ENCOUNTER — Encounter (HOSPITAL_COMMUNITY): Payer: Self-pay | Admitting: *Deleted

## 2015-11-11 ENCOUNTER — Inpatient Hospital Stay (HOSPITAL_COMMUNITY)
Admission: AD | Admit: 2015-11-11 | Discharge: 2015-11-11 | Disposition: A | Discharge status: Home / Self Care | Payer: BLUE CROSS/BLUE SHIELD | Source: Ambulatory Visit | Attending: Obstetrics & Gynecology | Admitting: Obstetrics & Gynecology

## 2015-11-11 DIAGNOSIS — Z803 Family history of malignant neoplasm of breast: Secondary | ICD-10-CM | POA: Insufficient documentation

## 2015-11-11 DIAGNOSIS — Z8249 Family history of ischemic heart disease and other diseases of the circulatory system: Secondary | ICD-10-CM | POA: Diagnosis not present

## 2015-11-11 DIAGNOSIS — O219 Vomiting of pregnancy, unspecified: Secondary | ICD-10-CM

## 2015-11-11 DIAGNOSIS — Z7982 Long term (current) use of aspirin: Secondary | ICD-10-CM | POA: Diagnosis not present

## 2015-11-11 DIAGNOSIS — O218 Other vomiting complicating pregnancy: Secondary | ICD-10-CM | POA: Insufficient documentation

## 2015-11-11 DIAGNOSIS — Z823 Family history of stroke: Secondary | ICD-10-CM | POA: Diagnosis not present

## 2015-11-11 DIAGNOSIS — Z3A10 10 weeks gestation of pregnancy: Secondary | ICD-10-CM | POA: Diagnosis not present

## 2015-11-11 DIAGNOSIS — Z833 Family history of diabetes mellitus: Secondary | ICD-10-CM | POA: Insufficient documentation

## 2015-11-11 DIAGNOSIS — Z832 Family history of diseases of the blood and blood-forming organs and certain disorders involving the immune mechanism: Secondary | ICD-10-CM | POA: Insufficient documentation

## 2015-11-11 DIAGNOSIS — Z8489 Family history of other specified conditions: Secondary | ICD-10-CM | POA: Insufficient documentation

## 2015-11-11 DIAGNOSIS — Z809 Family history of malignant neoplasm, unspecified: Secondary | ICD-10-CM | POA: Diagnosis not present

## 2015-11-11 DIAGNOSIS — Z79899 Other long term (current) drug therapy: Secondary | ICD-10-CM | POA: Insufficient documentation

## 2015-11-11 DIAGNOSIS — R112 Nausea with vomiting, unspecified: Secondary | ICD-10-CM | POA: Diagnosis present

## 2015-11-11 HISTORY — DX: Headache: R51

## 2015-11-11 HISTORY — DX: Headache, unspecified: R51.9

## 2015-11-11 LAB — URINALYSIS, ROUTINE W REFLEX MICROSCOPIC
BILIRUBIN URINE: NEGATIVE
Glucose, UA: NEGATIVE mg/dL
Hgb urine dipstick: NEGATIVE
Leukocytes, UA: NEGATIVE
Nitrite: NEGATIVE
Protein, ur: NEGATIVE mg/dL
Specific Gravity, Urine: 1.02 (ref 1.005–1.030)
pH: 6.5 (ref 5.0–8.0)

## 2015-11-11 LAB — COMPREHENSIVE METABOLIC PANEL
ALBUMIN: 3.7 g/dL (ref 3.5–5.0)
ALT: 36 U/L (ref 14–54)
ANION GAP: 6 (ref 5–15)
AST: 24 U/L (ref 15–41)
Alkaline Phosphatase: 54 U/L (ref 38–126)
BILIRUBIN TOTAL: 0.7 mg/dL (ref 0.3–1.2)
BUN: 11 mg/dL (ref 6–20)
CO2: 22 mmol/L (ref 22–32)
Calcium: 9 mg/dL (ref 8.9–10.3)
Chloride: 106 mmol/L (ref 101–111)
Creatinine, Ser: 0.55 mg/dL (ref 0.44–1.00)
GFR calc Af Amer: >60 mL/min (ref >60)
GFR calc non Af Amer: >60 mL/min (ref >60)
GLUCOSE: 84 mg/dL (ref 65–99)
Potassium: 3.6 mmol/L (ref 3.5–5.1)
SODIUM: 134 mmol/L — AB (ref 135–145)
TOTAL PROTEIN: 7.8 g/dL (ref 6.5–8.1)

## 2015-11-11 MED ORDER — DEXTROSE 5 % IN LACTATED RINGERS IV BOLUS
1000.0000 mL | Freq: Once | INTRAVENOUS | Status: AC
  Administered 2015-11-11: 1000 mL via INTRAVENOUS

## 2015-11-11 MED ORDER — FAMOTIDINE IN NACL 20-0.9 MG/50ML-% IV SOLN
20.0000 mg | Freq: Once | INTRAVENOUS | Status: AC
  Administered 2015-11-11: 20 mg via INTRAVENOUS
  Filled 2015-11-11: qty 50

## 2015-11-11 MED ORDER — PROMETHAZINE HCL 12.5 MG PO TABS: 12.5000 mg | ORAL_TABLET | Freq: Four times a day (QID) | ORAL | Status: DC | PRN

## 2015-11-11 MED ORDER — M.V.I. ADULT IV INJ
Freq: Once | INTRAVENOUS | Status: DC
  Filled 2015-11-11: qty 1000

## 2015-11-11 MED ORDER — METOCLOPRAMIDE HCL 5 MG/ML IJ SOLN
10.0000 mg | Freq: Once | INTRAMUSCULAR | Status: AC
  Administered 2015-11-11: 10 mg via INTRAVENOUS
  Filled 2015-11-11: qty 2

## 2015-11-11 NOTE — MAU Note (Signed)
C/o intermittent N&V for past 2 weeks; c/o diarrhea for past 3 days; called OB and was told to go to MAU, that she may need IV fluids;

## 2015-11-11 NOTE — MAU Note (Signed)
C/o N&V for past 2 weeks; c/o diarrhea for past 3 days;

## 2015-11-11 NOTE — MAU Provider Note (Signed)
History     CSN: [id number]  Arrival date and time: 11/11/15 1754   First Provider Initiated Contact with Patient 11/11/15 1856      Chief Complaint  Patient presents with  . Nausea  . Emesis   HPI   Ms..Sarah Duran is 29 y.o. female G4P2012 at [redacted]w[redacted]d presenting with N/V/D. Diarrhea started 3 days ago, the nausea and vomiting have been off and on throughout the pregnancy. N/V symptoms have worsened in the last week. Patient has not taken anything for the N/V.   She has had 3 episodes of diarrhea each day for 3 days.   + Burning in throat "feel like heartburn".   OB History    Gravida Para Term Preterm AB TAB SAB Ectopic Multiple Living   4 2 2  1 0  1  2      Past Medical History  Diagnosis Date  . Heart murmur   . Ectopic pregnancy 02/24/2011  . Hx of migraines     pt states that it is sometimes just frequent headaches  . Anemia   . Abnormal Pap smear     2012  . Preterm labor   . Hypertension 2014    gestational HTN w/o significant protenuria in 3rd trimester  . Headache     Past Surgical History  Procedure Laterality Date  . Cesarean section    . Laparoscopy  02/24/2011    Procedure: LAPAROSCOPY OPERATIVE;  Surgeon: Eleanor E Greene, MD;  Location: [facility];  Service: Gynecology;  Laterality: N/A;  . Laparotomy  02/24/2011    Procedure: EXPLORATORY LAPAROTOMY;  Surgeon: Eleanor E Greene, MD;  Location: [facility];  Service: Gynecology;  Laterality: N/A;  . Wisdom tooth extraction    . Colposcopy      2012?  . Unilateral salpingectomy  02/24/2011    Right due to ectopic   . Cesarean section N/A 06/14/2013    Procedure: Repeat Cesarean Section Delivery Baby Boy  @  0645  , Apgars 8/9;  Surgeon: Angela Y Roberts, MD;  Location: [facility];  Service: Obstetrics;  Laterality: N/A;    Family History  Problem Relation Age of Onset  . Heart disease Paternal Grandmother   . Stroke Paternal Grandmother   . Diabetes Paternal Grandmother   . Cancer Maternal Grandmother      ? kind  . Cancer Maternal Grandfather     ? kind  . Hypertension Mother   . Sickle cell trait Sister   . Hypertension Sister   . Breast cancer Maternal Aunt   . Cervical cancer Maternal Aunt   . Other Child     intestinal problems  . Rheumatic fever Maternal Uncle   . Hypertension Sister     Social History  Substance Use Topics  . Smoking status: Never Smoker   . Smokeless tobacco: Never Used  . Alcohol Use: No    Allergies: No Known Allergies  Prescriptions prior to admission  Medication Sig Dispense Refill Last Dose  . acetaminophen (TYLENOL) 500 MG tablet Take 1,000 mg by mouth every 6 (six) hours as needed for pain.   Past Month at Unknown time  . amoxicillin-clavulanate (AUGMENTIN) 500-125 MG tablet Take 1 tablet by mouth 2 (two) times daily. For 7 days 03-23 to 03-30   11/11/2015 at Unknown time  . Doxylamine-Pyridoxine (DICLEGIS) 10-10 MG TBEC Take 1 tablet by mouth 2 (two) times daily as needed (nausea).   11/10/2015 at Unknown time  . Prenatal Vit-Fe Fumarate-FA (PRENATAL MULTIVITAMIN) TABS   Take 1 tablet by mouth daily at 12 noon.   11/11/2015 at Unknown time  . ibuprofen (ADVIL,MOTRIN) 600 MG tablet Take 1 tablet (600 mg total) by mouth every 6 (six) hours as needed for pain. (Patient not taking: Reported on 11/11/2015) 36 tablet 2   . labetalol (NORMODYNE) 100 MG tablet Take 2 tablets (200 mg total) by mouth 2 (two) times daily. (Patient not taking: Reported on 11/11/2015) 60 tablet 4   . oxyCODONE-acetaminophen (PERCOCET/ROXICET) 5-325 MG per tablet Take 1-2 tablets by mouth every 4 (four) hours as needed. (Patient not taking: Reported on 11/11/2015) 30 tablet 0    Results for orders placed or performed during the hospital encounter of 11/11/15 (from the past 48 hour(s))  Urinalysis, Routine w reflex microscopic (not at [facility])     Status: Abnormal   Collection Time: 11/11/15  5:30 PM  Result Value Ref Range   Color, Urine YELLOW YELLOW   APPearance HAZY (A) CLEAR    Specific Gravity, Urine 1.020 1.005 - 1.030   pH 6.5 5.0 - 8.0   Glucose, UA NEGATIVE NEGATIVE mg/dL   Hgb urine dipstick NEGATIVE NEGATIVE   Bilirubin Urine NEGATIVE NEGATIVE   Ketones, ur >80 (A) NEGATIVE mg/dL   Protein, ur NEGATIVE NEGATIVE mg/dL   Nitrite NEGATIVE NEGATIVE   Leukocytes, UA NEGATIVE NEGATIVE    Comment: MICROSCOPIC NOT DONE ON URINES WITH NEGATIVE PROTEIN, BLOOD, LEUKOCYTES, NITRITE, OR GLUCOSE <1000 mg/dL.  Comprehensive metabolic panel     Status: Abnormal   Collection Time: 11/11/15  7:03 PM  Result Value Ref Range   Sodium 134 (L) 135 - 145 mmol/L   Potassium 3.6 3.5 - 5.1 mmol/L   Chloride 106 101 - 111 mmol/L   CO2 22 22 - 32 mmol/L   Glucose, Bld 84 65 - 99 mg/dL   BUN 11 6 - 20 mg/dL   Creatinine, Ser 0.55 0.44 - 1.00 mg/dL   Calcium 9.0 8.9 - 10.3 mg/dL   Total Protein 7.8 6.5 - 8.1 g/dL   Albumin 3.7 3.5 - 5.0 g/dL   AST 24 15 - 41 U/L   ALT 36 14 - 54 U/L   Alkaline Phosphatase 54 38 - 126 U/L   Total Bilirubin 0.7 0.3 - 1.2 mg/dL   GFR calc non Af Amer >60 >60 mL/min   GFR calc Af Amer >60 >60 mL/min    Comment: (NOTE) The eGFR has been calculated using the CKD EPI equation. This calculation has not been validated in all clinical situations. eGFR's persistently <60 mL/min signify possible Chronic Kidney Disease.    Anion gap 6 5 - 15    Review of Systems  Constitutional: Negative for fever.  Gastrointestinal: Positive for heartburn, nausea, vomiting and diarrhea.  Genitourinary: Negative for dysuria.   Physical Exam   Blood pressure 118/83, pulse 111, temperature 99 F (37.2 C), temperature source Oral, resp. rate 18, unknown if currently breastfeeding.  Physical Exam  Constitutional: She is oriented to person, place, and time. She appears well-developed and well-nourished. No distress.  Musculoskeletal: Normal range of motion.  Neurological: She is alert and oriented to person, place, and time.  Skin: She is not diaphoretic.   Psychiatric: Her behavior is normal.    MAU Course  Procedures  none  MDM  D5LR bolus MVI X 1 bolus Reglan 10 mg IV Pepcid 20 mg IV CMP  Report given to Kerry Hough who resumes care of the patient @ [place], [place]  2000 -  Care assumed from Jennifer Rasch, NP. Labs pending. Patient receiving IV fluids Patient reports significant improvement in symptoms after anti-emetics and IV fluids.  Discussed patient with Dr. Morris. Agrees with plan for discharge after IV fluids with Rx for Phenergan.   Assessment and Plan  A: SIUP at [redacted]w[redacted]d Nausea and vomiting in pregnancy prior to [redacted] weeks gestation   P: Discharge home Rx for Phenergan given to patient  Diet for N/V in pregnancy discussed Patient advised to follow-up with [facility] as scheduled or sooner PRN Patient may return to MAU as needed or if her condition were to change or worsen  Julie N Wenzel, PA-C 11/11/2015 9:40 PM    

## 2015-11-11 NOTE — Discharge Instructions (Signed)
Morning Sickness °Morning sickness is when you feel sick to your stomach (nauseous) during pregnancy. You may feel sick to your stomach and throw up (vomit). You may feel sick in the morning, but you can feel this way any time of day. Some women feel very sick to their stomach and cannot stop throwing up (hyperemesis gravidarum). °HOME CARE °· Only take medicines as told by your doctor. °· Take multivitamins as told by your doctor. Taking multivitamins before getting pregnant can stop or lessen the harshness of morning sickness. °· Eat dry toast or unsalted crackers before getting out of bed. °· Eat 5 to 6 small meals a day. °· Eat dry and bland foods like rice and baked potatoes. °· Do not drink liquids with meals. Drink between meals. °· Do not eat greasy, fatty, or spicy foods. °· Have someone cook for you if the smell of food causes you to feel sick or throw up. °· If you feel sick to your stomach after taking prenatal vitamins, take them at night or with a snack. °· Eat protein when you need a snack (nuts, yogurt, cheese). °· Eat unsweetened gelatins for dessert. °· Wear a bracelet used for sea sickness (acupressure wristband). °· Go to a doctor that puts thin needles into certain body points (acupuncture) to improve how you feel. °· Do not smoke. °· Use a humidifier to keep the air in your house free of odors. °· Get lots of fresh air. °GET HELP IF: °· You need medicine to feel better. °· You feel dizzy or lightheaded. °· You are losing weight. °GET HELP RIGHT AWAY IF:  °· You feel very sick to your stomach and cannot stop throwing up. °· You pass out (faint). °MAKE SURE YOU: °· Understand these instructions. °· Will watch your condition. °· Will get help right away if you are not doing well or get worse. °  °This information is not intended to replace advice given to you by your health care provider. Make sure you discuss any questions you have with your health care provider. °  °Document Released: 09/10/2004  Document Revised: 08/24/2014 Document Reviewed: 01/18/2013 °Elsevier Interactive Patient Education ©2016 [employer] °Eating Plan for Hyperemesis Gravidarum °Severe cases of hyperemesis gravidarum can lead to dehydration and malnutrition. The hyperemesis eating plan is one way to lessen the symptoms of nausea and vomiting. It is often used with prescribed medicines to control your symptoms.  °WHAT CAN I DO TO RELIEVE MY SYMPTOMS? °Listen to your body. Everyone is different and has different preferences. Find what works best for you. Some of the following things may help: °· Eat and drink slowly. °· Eat 5-6 small meals daily instead of 3 large meals.   °· Eat crackers before you get out of bed in the morning.   °· Starchy foods are usually well tolerated (such as cereal, toast, bread, potatoes, pasta, rice, and pretzels).   °· Ginger may help with nausea. Add ¼ tsp ground ginger to hot tea or choose ginger tea.   °· Try drinking 100% fruit juice or an electrolyte drink. °· Continue to take your prenatal vitamins as directed by your health care provider. If you are having trouble taking your prenatal vitamins, talk with your health care provider about different options. °· Include at least 1 serving of protein with your meals and snacks (such as meats or poultry, beans, nuts, eggs, or yogurt). Try eating a protein-rich snack before bed (such as cheese and crackers or a half turkey or peanut butter sandwich). °  WHAT THINGS SHOULD I AVOID TO REDUCE MY SYMPTOMS? °The following things may help reduce your symptoms: °· Avoid foods with strong smells. Try eating meals in well-ventilated areas that are free of odors. °· Avoid drinking water or other beverages with meals. Try not to drink anything less than 30 minutes before and after meals. °· Avoid drinking more than 1 cup of fluid at a time. °· Avoid fried or high-fat foods, such as butter and cream sauces. °· Avoid spicy foods. °· Avoid skipping meals the best you can.  Nausea can be more intense on an empty stomach. If you cannot tolerate food at that time, do not force it. Try sucking on ice chips or other frozen items and make up the calories later. °· Avoid lying down within 2 hours after eating. °  °This information is not intended to replace advice given to you by your health care provider. Make sure you discuss any questions you have with your health care provider. °  °Document Released: 05/31/2007 Document Revised: 08/08/2013 Document Reviewed: 06/07/2013 °Elsevier Interactive Patient Education ©2016 [employer] ° °

## 2016-07-27 ENCOUNTER — Encounter: Payer: Self-pay | Admitting: Gastroenterology

## 2016-09-14 ENCOUNTER — Ambulatory Visit: Payer: BLUE CROSS/BLUE SHIELD | Admitting: Gastroenterology

## 2016-09-15 ENCOUNTER — Encounter (HOSPITAL_COMMUNITY): Payer: Self-pay

## 2018-11-01 ENCOUNTER — Other Ambulatory Visit: Payer: Self-pay

## 2018-11-01 ENCOUNTER — Encounter (HOSPITAL_COMMUNITY): Payer: Self-pay

## 2018-11-01 ENCOUNTER — Ambulatory Visit (HOSPITAL_COMMUNITY)
Admission: EM | Admit: 2018-11-01 | Discharge: 2018-11-01 | Disposition: A | Discharge status: Home / Self Care | Payer: Medicaid Other | Attending: Family Medicine | Admitting: Family Medicine

## 2018-11-01 DIAGNOSIS — R1011 Right upper quadrant pain: Secondary | ICD-10-CM

## 2018-11-01 NOTE — ED Notes (Signed)
Pt is scheduled for Thursday 11/03/2018 at 11 am for abdominal ultrasound; Pt NPO after midnight

## 2018-11-01 NOTE — ED Provider Notes (Addendum)
[facility]    CSN: [id number] Arrival date & time: 11/01/18  6270     History   Chief Complaint Chief Complaint  Patient presents with  . Abdominal Pain    HPI Sarah Duran is a 33 y.o. female.   Patient is a 33 year old female who presents today with right upper quadrant tenderness.  This started yesterday.  Her symptoms have been intermittent and waxing and waning.  There is some radiation of pain into her upper back and right shoulder.  She has been trying over-the-counter acid reliever without much relief.  She is having some slight nausea.  Denies any vomiting or diarrhea.  Denies any fevers.  She has low-grade fever here today in clinic.  She is concerned because she was told approximate 3 years ago when she was pregnant that she was having gallbladder issues at that time.  She never followed up on the issue and changed her diet and the symptoms somewhat resolved.   ROS per HPI      Past Medical History:  Diagnosis Date  . Abnormal Pap smear    2012  . Anemia   . Ectopic pregnancy 02/24/2011  . Headache   . Heart murmur   . Hx of migraines    pt states that it is sometimes just frequent headaches  . Hypertension 2014   gestational HTN w/o significant protenuria in 3rd trimester  . Preterm labor     Patient Active Problem List   Diagnosis Date Noted  . Status post repeat low transverse cesarean section 06/17/2013  . First stage of labor problem 06/12/2013  . Gestational hypertension w/o significant proteinuria in 3rd trimester 06/12/2013  . Previous cesarean delivery, antepartum condition or complication 06/12/2013  . 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 Atypical squamous cell changes of undetermined significance favor benign (ASCUS favor benign) 11/10/2012  . Ectopic pregnancy, tubal 02/24/2011    Past Surgical History:  Procedure Laterality Date  . CESAREAN SECTION    . CESAREAN SECTION N/A 06/14/2013   Procedure: Repeat Cesarean Section  Delivery Baby Boy  @  [phone]  , Apgars 8/9;  Surgeon: Purcell Nails, MD;  Location: [facility];  Service: Obstetrics;  Laterality: N/A;  . COLPOSCOPY     2012?  Marland Kitchen LAPAROSCOPY  02/24/2011   Procedure: LAPAROSCOPY OPERATIVE;  Surgeon: Fortino Sic, MD;  Location: [facility];  Service: Gynecology;  Laterality: N/A;  . LAPAROTOMY  02/24/2011   Procedure: EXPLORATORY LAPAROTOMY;  Surgeon: Fortino Sic, MD;  Location: [facility];  Service: Gynecology;  Laterality: N/A;  . UNILATERAL SALPINGECTOMY  02/24/2011   Right due to ectopic   . WISDOM TOOTH EXTRACTION      OB History    Gravida  4   Para  2   Term  2   Preterm      AB  1   Living  2     SAB      TAB  0   Ectopic  1   Multiple      Live Births  2            Home Medications    Prior to Admission medications   Medication Sig Start Date End Date Taking? Authorizing Provider  acetaminophen (TYLENOL) 500 MG tablet Take 1,000 mg by mouth every 6 (six) hours as needed for pain.    [provider]  amoxicillin-clavulanate (AUGMENTIN) 500-125 MG tablet Take 1 tablet by mouth 2 (two) times daily. For 7 days  03-23 to 03-30    [provider]  Doxylamine-Pyridoxine (DICLEGIS) 10-10 MG TBEC Take 1 tablet by mouth 2 (two) times daily as needed (nausea).    [provider]  Prenatal Vit-Fe Fumarate-FA (PRENATAL MULTIVITAMIN) TABS Take 1 tablet by mouth daily at 12 noon.    [provider]  promethazine (PHENERGAN) 12.5 MG tablet Take 1 tablet (12.5 mg total) by mouth every 6 (six) hours as needed for nausea or vomiting. 11/11/15   Marny Lowenstein, PA-C    Family History Family History  Problem Relation Age of Onset  . Heart disease Paternal Grandmother   . Stroke Paternal Grandmother   . Diabetes Paternal Grandmother   . Cancer Maternal Grandmother        ? kind  . Cancer Maternal Grandfather        ? kind  . Hypertension Mother   . Sickle cell trait Sister   . Hypertension Sister   .  Breast cancer Maternal Aunt   . Cervical cancer Maternal Aunt   . Other Child        intestinal problems  . Rheumatic fever Maternal Uncle   . Hypertension Sister     Social History Social History   Tobacco Use  . Smoking status: Never Smoker  . Smokeless tobacco: Never Used  Substance Use Topics  . Alcohol use: No  . Drug use: No     Allergies   Patient has no known allergies.   Review of Systems Review of Systems  Constitutional: Positive for fever. Negative for chills and fatigue.  Respiratory: Negative for shortness of breath.   Gastrointestinal: Positive for abdominal pain and nausea. Negative for blood in stool, constipation, diarrhea and vomiting.  Musculoskeletal: Positive for arthralgias.  Skin: Negative for color change and rash.     Physical Exam Triage Vital Signs ED Triage Vitals  Enc Vitals Group     BP 11/01/18 1017 117/81     Pulse Rate 11/01/18 1017 86     Resp 11/01/18 1017 16     Temp 11/01/18 1017 99.1 F (37.3 C)     Temp Source 11/01/18 1017 Oral     SpO2 11/01/18 1017 100 %     Weight --      Height --      Head Circumference --      Peak Flow --      Pain Score 11/01/18 1018 6     Pain Loc --      Pain Edu? --      Excl. in GC? --    No data found.  Updated Vital Signs BP 117/81 (BP Location: Right Arm)   Pulse 86   Temp 99.1 F (37.3 C) (Oral)   Resp 16   SpO2 100%   Breastfeeding No   Visual Acuity Right Eye Distance:   Left Eye Distance:   Bilateral Distance:    Right Eye Near:   Left Eye Near:    Bilateral Near:     Physical Exam Vitals signs and nursing note reviewed.  Constitutional:      General: She is not in acute distress.    Appearance: She is well-developed. She is not ill-appearing, toxic-appearing or diaphoretic.  HENT:     Head: Normocephalic and atraumatic.  Cardiovascular:     Rate and Rhythm: Normal rate and regular rhythm.     Heart sounds: Normal heart sounds.  Pulmonary:     Effort:  Pulmonary effort is normal.  Breath sounds: Normal breath sounds.  Abdominal:     General: Bowel sounds are normal.     Palpations: Abdomen is soft. There is no hepatomegaly.     Tenderness: There is abdominal tenderness in the right upper quadrant. There is no right CVA tenderness or left CVA tenderness.  Skin:    General: Skin is warm and dry.  Neurological:     Mental Status: She is alert.  Psychiatric:        Mood and Affect: Mood normal.      UC Treatments / Results  Labs (all labs ordered are listed, but only abnormal results are displayed) Labs Reviewed - No data to display  EKG None  Radiology No results found.  Procedures Procedures (including critical care time)  Medications Ordered in UC Medications - No data to display  Initial Impression / Assessment and Plan / UC Course  I have reviewed the triage vital signs and the nursing notes.  Pertinent labs & imaging results that were available during my care of the patient were reviewed by me and considered in my medical decision making (see chart for details).     Patient with acute onset of right upper quadrant tenderness and radiation into the right shoulder since last night.  Symptoms have been waxing and waning. She has some slight nausea. Obvious tenderness on exam. Will send for right upper quadrant ultrasound to rule out cholecystitis or gallstones. Patient going for ultrasound Thursday Instructed that if her symptoms get severely worse between now and then she would need to go the hospital Pt understanding and agrees Final Clinical Impressions(s) / UC Diagnoses   Final diagnoses:  Right upper quadrant abdominal pain     Discharge Instructions     We are scheduling you for an ultrasound of the abdomen for Thursday at 11 AM.  [facility] will go [facility]. You cannot have anything to eat after midnight the night prior.  Please arrive at least 15 minutes before your scheduled appointment     ED Prescriptions    None     Controlled Substance Prescriptions [place] Controlled Substance Registry consulted? Not Applicable        Janace Aris, NP 11/01/18 1139

## 2018-11-01 NOTE — Discharge Instructions (Signed)
We are scheduling you for an ultrasound of the abdomen for Thursday at 11 AM.  You will go to [facility] for this. You cannot have anything to eat after midnight the night prior.  Please arrive at least 15 minutes before your scheduled appointment

## 2018-11-01 NOTE — ED Triage Notes (Signed)
Pt present RUQ abdominal pain. Symptoms started yesterday. Pt tried OTC medication with no relief.

## 2018-11-03 ENCOUNTER — Telehealth (HOSPITAL_COMMUNITY): Payer: Self-pay | Admitting: Emergency Medicine

## 2018-11-03 ENCOUNTER — Ambulatory Visit (HOSPITAL_COMMUNITY)
Admission: RE | Admit: 2018-11-03 | Discharge: 2018-11-03 | Disposition: A | Discharge status: Home / Self Care | Payer: Medicaid Other | Source: Ambulatory Visit | Attending: Family Medicine | Admitting: Family Medicine

## 2018-11-03 ENCOUNTER — Ambulatory Visit (HOSPITAL_COMMUNITY): Payer: BLUE CROSS/BLUE SHIELD

## 2018-11-03 ENCOUNTER — Other Ambulatory Visit: Payer: Self-pay

## 2018-11-03 DIAGNOSIS — K819 Cholecystitis, unspecified: Secondary | ICD-10-CM | POA: Insufficient documentation

## 2018-11-03 NOTE — Telephone Encounter (Signed)
Negative exam, attempted to contact patient, no answer, no voicemail

## 2018-11-11 ENCOUNTER — Telehealth (HOSPITAL_COMMUNITY): Payer: Self-pay | Admitting: Emergency Medicine

## 2018-11-11 NOTE — Telephone Encounter (Signed)
Pt called for results.  Pt was told results were negative. Pt stated understanding.

## 2021-03-18 IMAGING — US ULTRASOUND ABDOMEN LIMITED
1 series · 14 of 25 positions shown · non-contrast
Comparison: None.

CLINICAL DATA: Right upper quadrant pain for 3 days.

EXAM:
ULTRASOUND ABDOMEN LIMITED RIGHT UPPER QUADRANT

[Series 1: ultrasound abdomen limited · 14 of 46 slices shown]
[im 1/46]
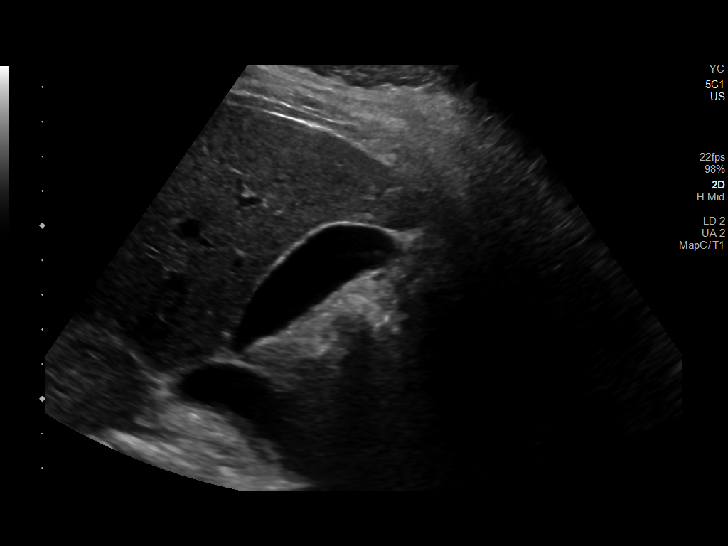
[im 4/46]
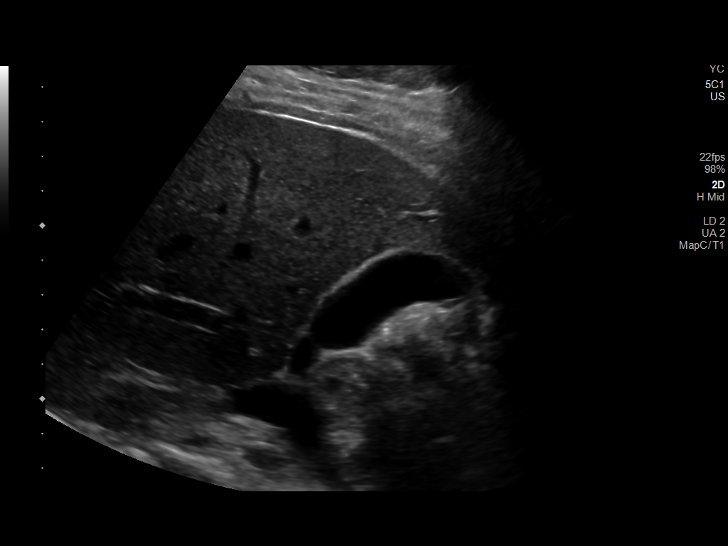
[im 8/46]
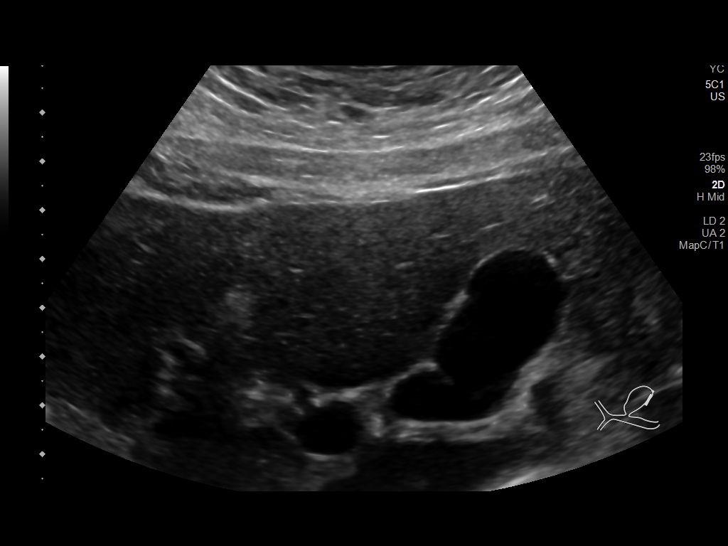
[im 12/46]
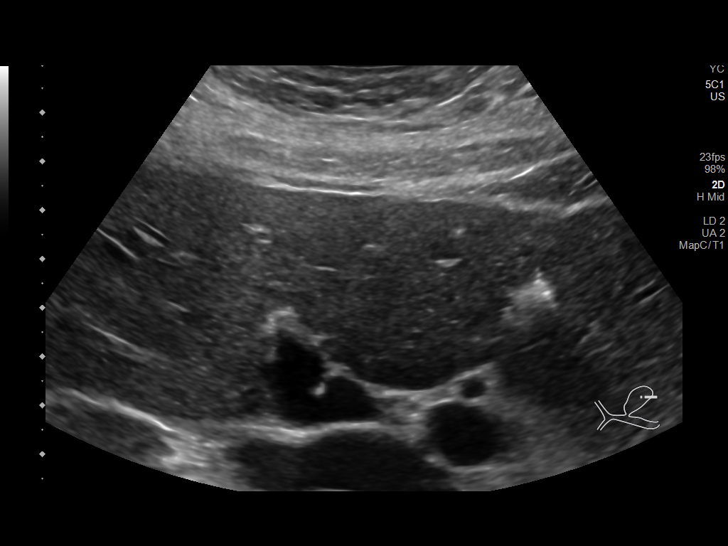
[im 16/46]
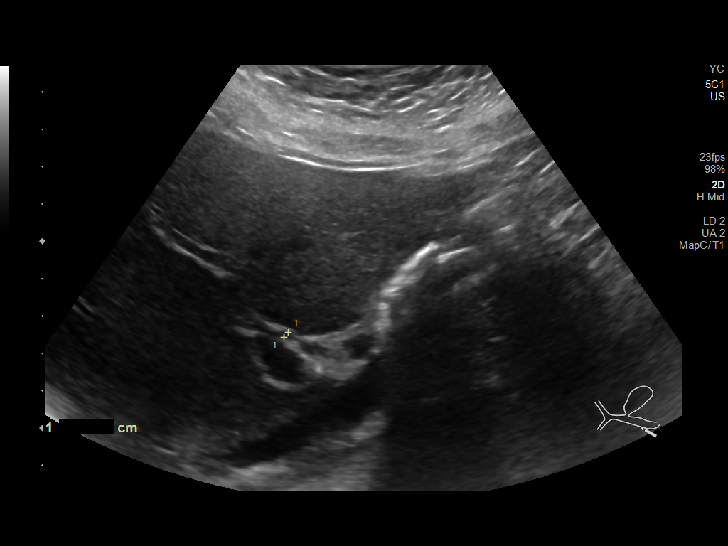
[im 17/46]
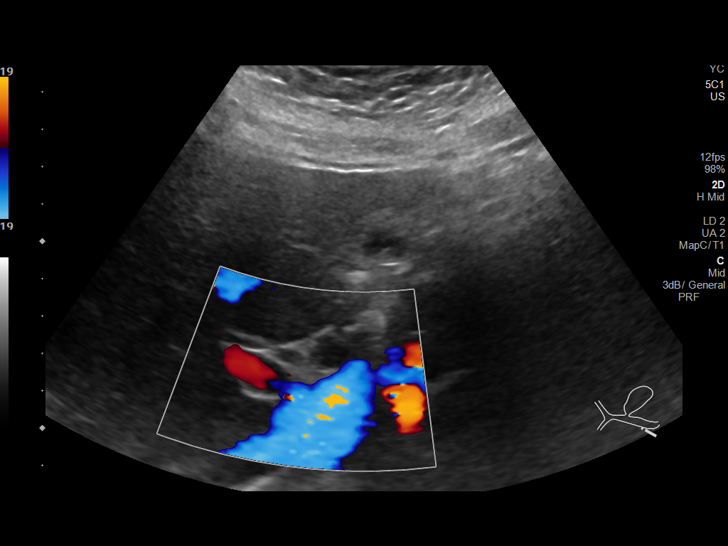
[im 21/46]
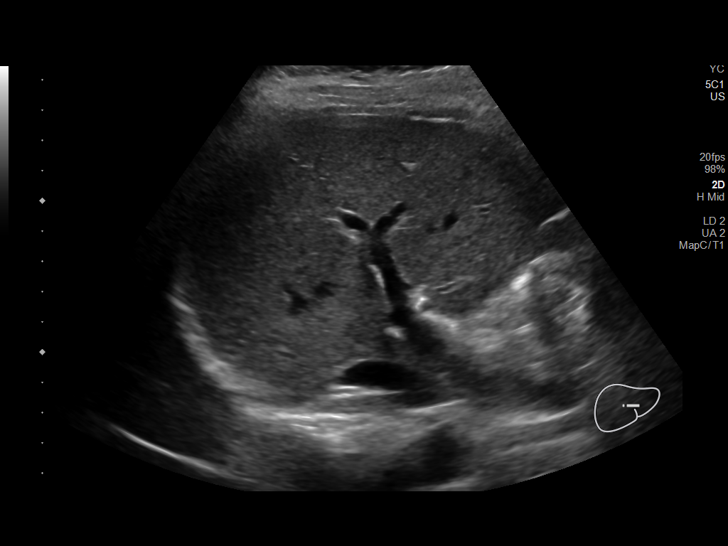
[im 25/46]
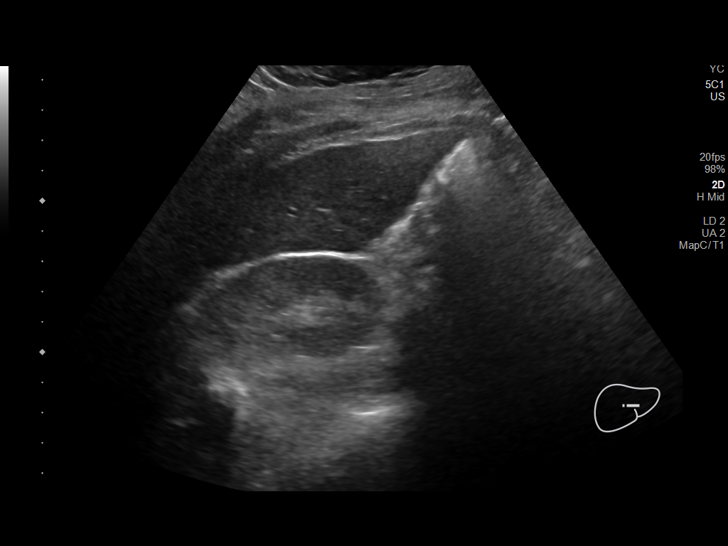
[im 29/46]
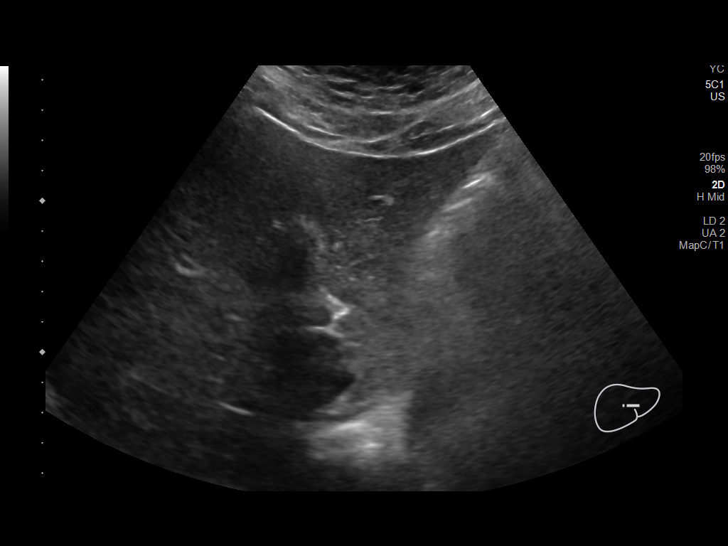
[im 31/46]
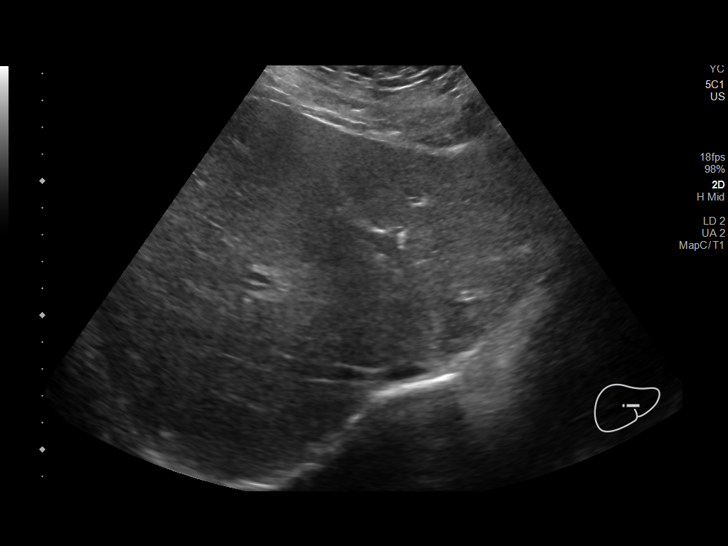
[im 34/46]
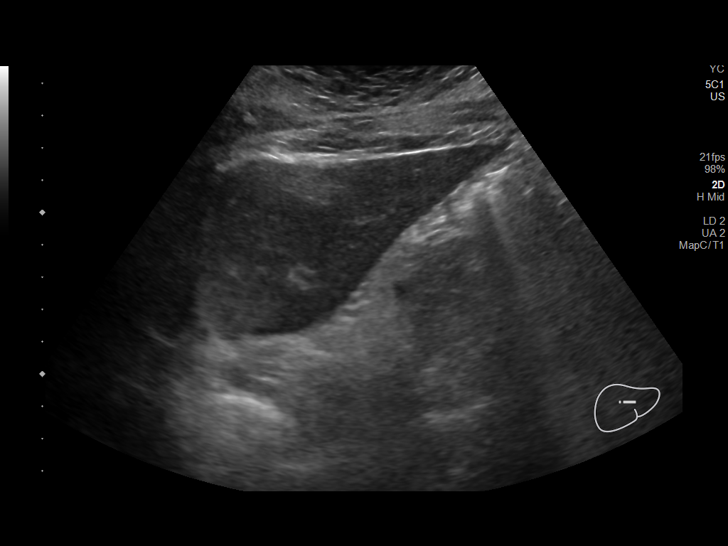
[im 38/46]
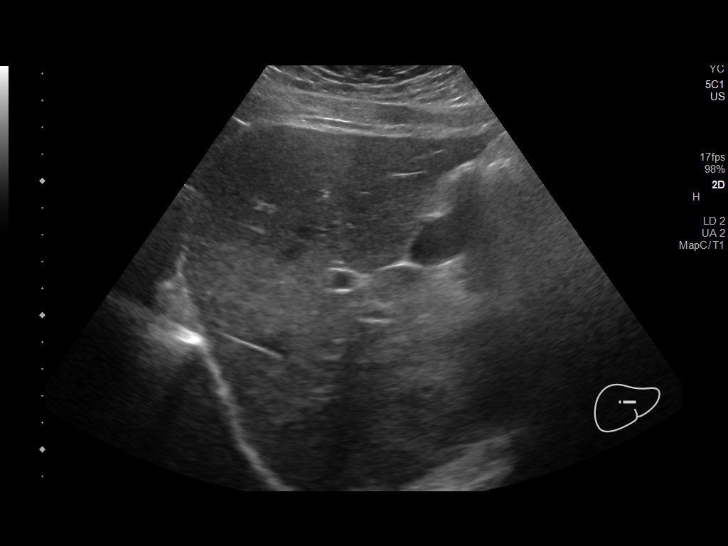
[im 42/46]
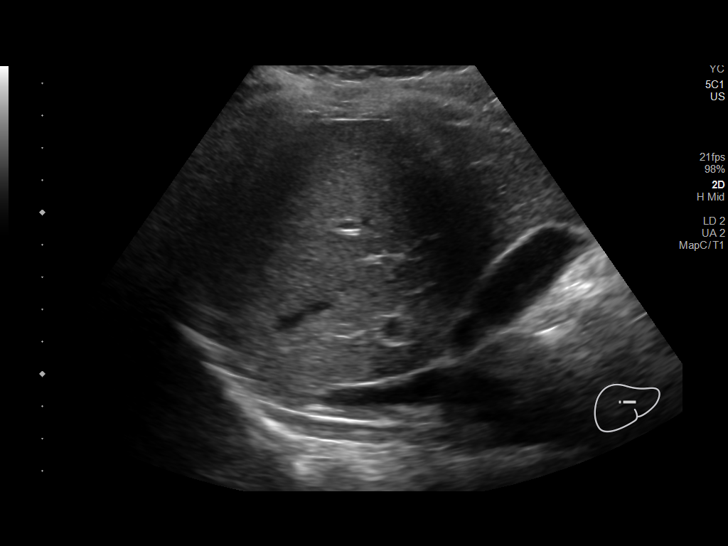
[im 46/46]
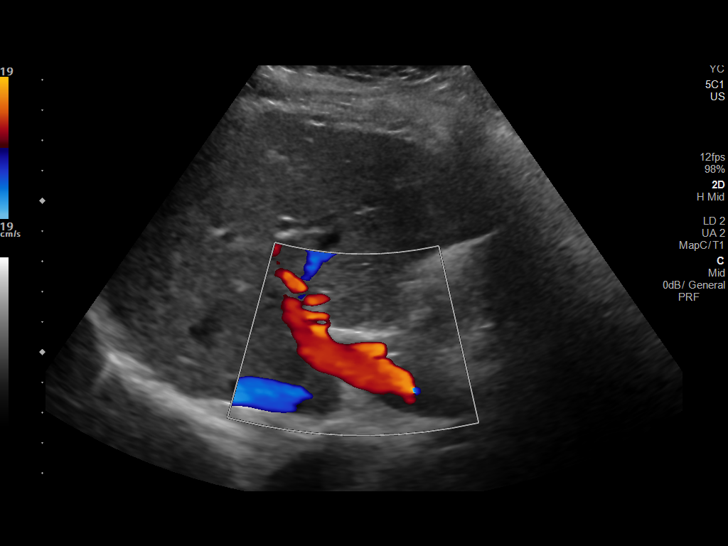

[14 of 25 positions shown; findings below may reference images not displayed]

FINDINGS: Gallbladder:

No gallstones or wall thickening visualized. No sonographic Murphy
sign noted by sonographer.

Common bile duct:

Diameter: 0.2 cm

Liver:

No focal lesion identified. Within normal limits in parenchymal
echogenicity. Portal vein is patent on color Doppler imaging with
normal direction of blood flow towards the liver.
IMPRESSION: Negative for gallstones.  Normal exam.

## 2021-04-11 ENCOUNTER — Other Ambulatory Visit: Payer: Self-pay

## 2021-04-11 ENCOUNTER — Encounter (HOSPITAL_COMMUNITY): Payer: Self-pay

## 2021-04-11 ENCOUNTER — Emergency Department (HOSPITAL_COMMUNITY)
Admission: EM | Admit: 2021-04-11 | Discharge: 2021-04-12 | Disposition: A | Discharge status: Home / Self Care | Payer: No Typology Code available for payment source | Attending: Emergency Medicine | Admitting: Emergency Medicine

## 2021-04-11 DIAGNOSIS — M7989 Other specified soft tissue disorders: Secondary | ICD-10-CM | POA: Diagnosis not present

## 2021-04-11 DIAGNOSIS — M5441 Lumbago with sciatica, right side: Secondary | ICD-10-CM | POA: Insufficient documentation

## 2021-04-11 DIAGNOSIS — M5431 Sciatica, right side: Secondary | ICD-10-CM

## 2021-04-11 DIAGNOSIS — R109 Unspecified abdominal pain: Secondary | ICD-10-CM | POA: Diagnosis not present

## 2021-04-11 DIAGNOSIS — I10 Essential (primary) hypertension: Secondary | ICD-10-CM | POA: Insufficient documentation

## 2021-04-11 DIAGNOSIS — M79651 Pain in right thigh: Secondary | ICD-10-CM | POA: Insufficient documentation

## 2021-04-11 DIAGNOSIS — M545 Low back pain, unspecified: Secondary | ICD-10-CM | POA: Diagnosis present

## 2021-04-11 LAB — I-STAT BETA HCG BLOOD, ED (MC, WL, AP ONLY): I-stat hCG, quantitative: <5 m[IU]/mL (ref <5)

## 2021-04-11 NOTE — ED Triage Notes (Signed)
Right flank pain radiating to right thigh x 4 days. Denies any nausea, vomiting, diarrhea and discharge. Pt tried ibuprofen but hasnt helped. Denies any trauma or strenuous activity.

## 2021-04-12 LAB — BASIC METABOLIC PANEL
Anion gap: 8 (ref 5–15)
BUN: 14 mg/dL (ref 6–20)
CO2: 26 mmol/L (ref 22–32)
Calcium: 9.4 mg/dL (ref 8.9–10.3)
Chloride: 103 mmol/L (ref 98–111)
Creatinine, Ser: 0.77 mg/dL (ref 0.44–1.00)
GFR, Estimated: >60 mL/min (ref >60)
Glucose, Bld: 101 mg/dL — ABNORMAL HIGH (ref 70–99)
Potassium: 3.8 mmol/L (ref 3.5–5.1)
Sodium: 137 mmol/L (ref 135–145)

## 2021-04-12 LAB — CBC
HCT: 39.9 % (ref 36.0–46.0)
Hemoglobin: 13 g/dL (ref 12.0–15.0)
MCH: 27.3 pg (ref 26.0–34.0)
MCHC: 32.6 g/dL (ref 30.0–36.0)
MCV: 83.8 fL (ref 80.0–100.0)
Platelets: 357 10*3/uL (ref 150–400)
RBC: 4.76 MIL/uL (ref 3.87–5.11)
RDW: 13.4 % (ref 11.5–15.5)
WBC: 7.2 10*3/uL (ref 4.0–10.5)
nRBC: 0 % (ref 0.0–0.2)

## 2021-04-12 LAB — URINALYSIS, ROUTINE W REFLEX MICROSCOPIC
Bilirubin Urine: NEGATIVE
Glucose, UA: NEGATIVE mg/dL
Hgb urine dipstick: NEGATIVE
Ketones, ur: 20 mg/dL — AB
Nitrite: POSITIVE — AB
Protein, ur: NEGATIVE mg/dL
Specific Gravity, Urine: 1.029 (ref 1.005–1.030)
pH: 5 (ref 5.0–8.0)

## 2021-04-12 MED ORDER — METHOCARBAMOL 500 MG PO TABS: 500.0000 mg | ORAL_TABLET | Freq: Four times a day (QID) | ORAL | 0 refills | Status: AC

## 2021-04-12 MED ORDER — PREDNISONE 10 MG PO TABS: ORAL_TABLET | ORAL | 0 refills | Status: DC

## 2021-04-12 NOTE — ED Provider Notes (Signed)
[facility] EMERGENCY DEPARTMENT Provider Note   CSN: [id number] Arrival date & time: 04/11/21  2216     History Chief Complaint  Patient presents with   Flank Pain   Leg Pain    Sarah Duran is a 35 y.o. female.  The history is provided by the patient. No language interpreter was used.  Flank Pain This is a new problem. Episode onset: 3 days. The problem occurs constantly. The problem has been gradually worsening. Nothing aggravates the symptoms. Nothing relieves the symptoms. She has tried nothing for the symptoms. The treatment provided no relief.  Leg Pain   Pt complains of pain right low back, buttock that radiates to right thigh.  Pt reports no relief with tylenol or ibuprofen   Past Medical History:  Diagnosis Date   Abnormal Pap smear    2012   Anemia    Ectopic pregnancy 02/24/2011   Headache    Heart murmur    Hx of migraines    pt states that it is sometimes just frequent headaches   Hypertension 2014   gestational HTN w/o significant protenuria in 3rd trimester   Preterm labor     Patient Active Problem List   Diagnosis Date Noted   Status post repeat low transverse cesarean section 06/17/2013   First stage of labor problem 06/12/2013   Gestational hypertension w/o significant proteinuria in 3rd trimester 06/12/2013   Previous cesarean delivery, antepartum condition or complication 06/12/2013   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   LAPAROSCOPY  02/24/2011   Procedure: LAPAROSCOPY OPERATIVE;   Surgeon: Fortino Sic, MD;  Location: [facility];  Service: Gynecology;  Laterality: N/A;   LAPAROTOMY  02/24/2011   Procedure: EXPLORATORY LAPAROTOMY;  Surgeon: Fortino Sic, MD;  Location: [facility];  Service: Gynecology;  Laterality: N/A;   UNILATERAL SALPINGECTOMY  02/24/2011   Right due to ectopic    WISDOM TOOTH EXTRACTION       OB History     Gravida  4   Para  2   Term  2   Preterm      AB  1   Living  2      SAB      IAB  0   Ectopic  1   Multiple      Live Births  2           Family History  Problem Relation Age of Onset   Heart disease Paternal Grandmother    Stroke Paternal Grandmother    Diabetes Paternal Grandmother    Cancer Maternal Grandmother        ? kind   Cancer Maternal Grandfather        ? kind   Hypertension Mother    Sickle cell trait Sister    Hypertension Sister    Breast cancer Maternal Aunt    Cervical cancer Maternal Aunt    Other Child        intestinal problems   Rheumatic fever Maternal Uncle  Hypertension Sister     Social History   Tobacco Use   Smoking status: Never   Smokeless tobacco: Never  Substance Use Topics   Alcohol use: No   Drug use: No    Home Medications Prior to Admission medications   Medication Sig Start Date End Date Taking? Authorizing Provider  acetaminophen (TYLENOL) 500 MG tablet Take 1,000 mg by mouth every 6 (six) hours as needed for pain.    [provider]  amoxicillin-clavulanate (AUGMENTIN) 500-125 MG tablet Take 1 tablet by mouth 2 (two) times daily. For 7 days 03-23 to 03-30    [provider]  Doxylamine-Pyridoxine (DICLEGIS) 10-10 MG TBEC Take 1 tablet by mouth 2 (two) times daily as needed (nausea).    [provider]  Prenatal Vit-Fe Fumarate-FA (PRENATAL MULTIVITAMIN) TABS Take 1 tablet by mouth daily at 12 noon.    [provider]  promethazine (PHENERGAN) 12.5 MG tablet Take 1 tablet (12.5 mg total) by mouth every 6 (six) hours as  needed for nausea or vomiting. 11/11/15   Marny Lowenstein, PA-C    Allergies    Patient has no known allergies.  Review of Systems   Review of Systems  Genitourinary:  Positive for flank pain.  All other systems reviewed and are negative.  Physical Exam Updated Vital Signs BP 109/72 (BP Location: Right Arm)   Pulse 78   Temp 98.4 F (36.9 C) (Oral)   Resp 17   Ht 5\' 5"  (1.651 m)   Wt 86.5 kg   SpO2 100%   BMI 31.73 kg/m   Physical Exam Vitals reviewed.  HENT:     Nose: Nose normal.  Cardiovascular:     Rate and Rhythm: Normal rate.  Musculoskeletal:        General: Swelling and tenderness present.     Cervical back: Normal range of motion.  Skin:    General: Skin is warm.  Neurological:     General: No focal deficit present.     Mental Status: She is alert.  Psychiatric:        Mood and Affect: Mood normal.    ED Results / Procedures / Treatments   Labs (all labs ordered are listed, but only abnormal results are displayed) Labs Reviewed  URINALYSIS, ROUTINE W REFLEX MICROSCOPIC - Abnormal; Notable for the following components:      Result Value   Color, Urine AMBER (*)    APPearance CLOUDY (*)    Ketones, ur 20 (*)    Nitrite POSITIVE (*)    Leukocytes,Ua TRACE (*)    Bacteria, UA RARE (*)    All other components within normal limits  BASIC METABOLIC PANEL - Abnormal; Notable for the following components:   Glucose, Bld 101 (*)    All other components within normal limits  CBC  I-STAT BETA HCG BLOOD, ED ([facility], [facility], [facility] ONLY)    EKG None  Radiology No results found.  Procedures Procedures   Medications Ordered in ED Medications - No data to display  ED Course  I have reviewed the triage vital signs and the nursing notes.  Pertinent labs & imaging results that were available during my care of the patient were reviewed by me and considered in my medical decision making (see chart for details).    MDM Rules/Calculators/A&P                            MDM:  symptoms sound  sciatic/radicular.   Pt given rx for prednisone and robaxin.  Pt advised to take tylenol  Final Clinical Impression(s) / ED Diagnoses Final diagnoses:  Sciatica of right side    Rx / DC Orders ED Discharge Orders     None     An After Visit Summary was printed and given to the patient.    Elson Areas, PA-C 04/12/21 0710    Pricilla Loveless, MD 04/14/21 [phone]

## 2021-04-22 ENCOUNTER — Other Ambulatory Visit: Payer: Self-pay

## 2021-04-22 ENCOUNTER — Ambulatory Visit (HOSPITAL_BASED_OUTPATIENT_CLINIC_OR_DEPARTMENT_OTHER)
Admission: RE | Admit: 2021-04-22 | Discharge: 2021-04-22 | Disposition: A | Discharge status: Home / Self Care | Payer: No Typology Code available for payment source | Source: Ambulatory Visit | Attending: Family Medicine | Admitting: Family Medicine

## 2021-04-22 ENCOUNTER — Ambulatory Visit (INDEPENDENT_AMBULATORY_CARE_PROVIDER_SITE_OTHER): Payer: No Typology Code available for payment source | Admitting: Family Medicine

## 2021-04-22 ENCOUNTER — Encounter: Payer: Self-pay | Admitting: Family Medicine

## 2021-04-22 VITALS — Ht 65.5 in | Wt 191.0 lb

## 2021-04-22 DIAGNOSIS — M5416 Radiculopathy, lumbar region: Secondary | ICD-10-CM | POA: Insufficient documentation

## 2021-04-22 MED ORDER — GABAPENTIN 100 MG PO CAPS: 100.0000 mg | ORAL_CAPSULE | Freq: Three times a day (TID) | ORAL | 1 refills | Status: AC

## 2021-04-22 NOTE — Assessment & Plan Note (Signed)
Symptoms are most consistent with radicular type pain.  Has been ongoing for years.  Has recently gotten worse over the past few days. -Counseled on home exercise therapy and supportive care. -X-ray. -Gabapentin. -Can continue muscle relaxer. -Could consider physical therapy or further imaging.

## 2021-04-22 NOTE — Patient Instructions (Signed)
Nice to meet you Please try the exercises  Please try setting a time limit for sitting.  Please start with gabapentin at night. You can increase to 2 or 3 times as you tolerate.   Please send me a message in MyChart with any questions or updates.  Please see me back in 6 weeks.   --Dr. Jordan Likes

## 2021-04-22 NOTE — Progress Notes (Signed)
Sarah Duran - 35 y.o. female MRN [id number]  Date of birth: 1986-04-10  SUBJECTIVE:  Including CC & ROS.  No chief complaint on file.   Amore Ackman is a 35 y.o. female that is presenting with radicular pain down the right leg.  This is acute on chronic in nature.  Symptoms are going down the right leg.  Pain was severe and was having altered sensation.  No history of surgery.   Review of Systems See HPI   HISTORY: Past Medical, Surgical, Social, and Family History Reviewed & Updated per EMR.   Pertinent Historical Findings include:  Past Medical History:  Diagnosis Date   Abnormal Pap smear    2012   Anemia    Ectopic pregnancy 02/24/2011   Headache    Heart murmur    Hx of migraines    pt states that it is sometimes just frequent headaches   Hypertension 2014   gestational HTN w/o significant protenuria in 3rd trimester   Preterm labor     Past Surgical History:  Procedure Laterality Date   CESAREAN SECTION     CESAREAN SECTION N/A 06/14/2013   Procedure: Repeat Cesarean Section Delivery Baby Boy  @  [phone]  , Apgars 8/9;  Surgeon: Purcell Nails, MD;  Location: [facility];  Service: Obstetrics;  Laterality: N/A;   COLPOSCOPY     2012?   LAPAROSCOPY  02/24/2011   Procedure: LAPAROSCOPY OPERATIVE;  Surgeon: Fortino Sic, MD;  Location: [facility];  Service: Gynecology;  Laterality: N/A;   LAPAROTOMY  02/24/2011   Procedure: EXPLORATORY LAPAROTOMY;  Surgeon: Fortino Sic, MD;  Location: [facility];  Service: Gynecology;  Laterality: N/A;   UNILATERAL SALPINGECTOMY  02/24/2011   Right due to ectopic    WISDOM TOOTH EXTRACTION      Family History  Problem Relation Age of Onset   Heart disease Paternal Grandmother    Stroke Paternal Grandmother    Diabetes Paternal Grandmother    Cancer Maternal Grandmother        ? kind   Cancer Maternal Grandfather        ? kind   Hypertension Mother    Sickle cell trait Sister    Hypertension Sister    Breast cancer Maternal  Aunt    Cervical cancer Maternal Aunt    Other Child        intestinal problems   Rheumatic fever Maternal Uncle    Hypertension Sister     Social History   Socioeconomic History   Marital status: Single    Spouse name: Not on file   Number of children: Not on file   Years of education: Not on file   Highest education level: Not on file  Occupational History   Not on file  Tobacco Use   Smoking status: Never   Smokeless tobacco: Never  Substance and Sexual Activity   Alcohol use: No   Drug use: No   Sexual activity: Yes    Birth control/protection: None  Other Topics Concern   Not on file  Social History Narrative   Not on file   Social Determinants of Health   Financial Resource Strain: Not on file  Food Insecurity: Not on file  Transportation Needs: Not on file  Physical Activity: Not on file  Stress: Not on file  Social Connections: Not on file  Intimate Partner Violence: Not on file     PHYSICAL EXAM:  VS: Ht 5' 5.5" (1.664 m)   [employer]  191 lb (86.6 kg)   BMI 31.30 kg/m  Physical Exam Gen: NAD, alert, cooperative with exam, well-appearing    ASSESSMENT & PLAN:   Lumbar radiculopathy Symptoms are most consistent with radicular type pain.  Has been ongoing for years.  Has recently gotten worse over the past few days. -Counseled on home exercise therapy and supportive care. -X-ray. -Gabapentin. -Can continue muscle relaxer. -Could consider physical therapy or further imaging.

## 2021-04-24 ENCOUNTER — Telehealth: Payer: Self-pay | Admitting: Family Medicine

## 2021-04-24 NOTE — Telephone Encounter (Signed)
Informed of results.   Myra Rude, MD [facility] 04/24/2021, 9:11 AM

## 2021-12-12 ENCOUNTER — Other Ambulatory Visit: Payer: Self-pay

## 2021-12-12 ENCOUNTER — Ambulatory Visit (HOSPITAL_COMMUNITY)
Admission: EM | Admit: 2021-12-12 | Discharge: 2021-12-12 | Disposition: A | Discharge status: Home / Self Care | Payer: No Typology Code available for payment source | Attending: Family Medicine | Admitting: Family Medicine

## 2021-12-12 ENCOUNTER — Encounter (HOSPITAL_COMMUNITY): Payer: Self-pay | Admitting: Emergency Medicine

## 2021-12-12 DIAGNOSIS — R1013 Epigastric pain: Secondary | ICD-10-CM | POA: Insufficient documentation

## 2021-12-12 DIAGNOSIS — J029 Acute pharyngitis, unspecified: Secondary | ICD-10-CM | POA: Insufficient documentation

## 2021-12-12 DIAGNOSIS — R112 Nausea with vomiting, unspecified: Secondary | ICD-10-CM | POA: Insufficient documentation

## 2021-12-12 LAB — POCT RAPID STREP A, ED / UC: Streptococcus, Group A Screen (Direct): NEGATIVE

## 2021-12-12 MED ORDER — OMEPRAZOLE 20 MG PO CPDR: 20.0000 mg | DELAYED_RELEASE_CAPSULE | Freq: Every day | ORAL | 0 refills | Status: AC

## 2021-12-12 MED ORDER — AMOXICILLIN 500 MG PO CAPS: 500.0000 mg | ORAL_CAPSULE | Freq: Three times a day (TID) | ORAL | 0 refills | Status: AC

## 2021-12-12 MED ORDER — ONDANSETRON HCL 4 MG PO TABS: 4.0000 mg | ORAL_TABLET | Freq: Four times a day (QID) | ORAL | 0 refills | Status: AC

## 2021-12-12 NOTE — ED Provider Notes (Signed)
?[facility] ? ? ? ?CSN: [id number] ?Arrival date & time: 12/12/21  [id number] ? ? ?  ? ?History   ?Chief Complaint ?Chief Complaint  ?Patient presents with  ? Sore Throat  ? Nausea  ? Emesis  ? Headache  ? ? ?HPI ?Sarah Duran is a 36 y.o. female.  ? ?Patient is here for sore throat, started about 1 week ago.  Then with headache, nausea, and vomiting.  ?She has been taking motrin/tylenol for the sore throat and the throat does improve.  ?Biggest issue is constant nausea and vomiting.  She vomited once yesterday.  No vomiting today.   Has abdominal pain centrally.  ?She has has had some diarrhea.  ?No known fevers.  No chills.  ?No runny nose, congestion, drainage.  ?No known sick contacts per.   ? ?Past Medical History:  ?Diagnosis Date  ? Abnormal Pap smear   ? 2012  ? Anemia   ? Ectopic pregnancy 02/24/2011  ? Headache   ? Heart murmur   ? Hx of migraines   ? pt states that it is sometimes just frequent headaches  ? Hypertension 2014  ? gestational HTN w/o significant protenuria in 3rd trimester  ? Preterm labor   ? ? ?Patient Active Problem List  ? Diagnosis Date Noted  ? Lumbar radiculopathy 04/22/2021  ? Status post repeat low transverse cesarean section 06/17/2013  ? First stage of labor problem 06/12/2013  ? Gestational hypertension w/o significant proteinuria in 3rd trimester 06/12/2013  ? Previous cesarean delivery, antepartum condition or complication 06/12/2013  ? 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 Atypical squamous cell changes of undetermined significance favor benign (ASCUS favor benign) 11/10/2012  ? Ectopic pregnancy, tubal 02/24/2011  ? ? ?Past Surgical History:  ?Procedure Laterality Date  ? CESAREAN SECTION    ? CESAREAN SECTION N/A 06/14/2013  ? Procedure: Repeat Cesarean Section Delivery Baby Boy  @  [phone]  , Apgars 8/9;  Surgeon: Purcell NailsAngela Y Roberts, MD;  Location: [facility];  Service: Obstetrics;  Laterality: N/A;  ? COLPOSCOPY    ? 2012?  ? LAPAROSCOPY  02/24/2011  ? Procedure: LAPAROSCOPY  OPERATIVE;  Surgeon: Fortino SicEleanor E Greene, MD;  Location: [facility];  Service: Gynecology;  Laterality: N/A;  ? LAPAROTOMY  02/24/2011  ? Procedure: EXPLORATORY LAPAROTOMY;  Surgeon: Fortino SicEleanor E Greene, MD;  Location: [facility];  Service: Gynecology;  Laterality: N/A;  ? UNILATERAL SALPINGECTOMY  02/24/2011  ? Right due to ectopic   ? WISDOM TOOTH EXTRACTION    ? ? ?OB History   ? ? Gravida  ?4  ? Para  ?2  ? Term  ?2  ? Preterm  ?   ? AB  ?1  ? Living  ?2  ?  ? ? SAB  ?   ? IAB  ?0  ? Ectopic  ?1  ? Multiple  ?   ? Live Births  ?2  ?   ?  ?  ? ? ? ?Home Medications   ? ?Prior to Admission medications   ?Medication Sig Start Date End Date Taking? Authorizing Provider  ?acetaminophen (TYLENOL) 500 MG tablet Take 1,000 mg by mouth every 6 (six) hours as needed for pain.    [provider]  ?amoxicillin-clavulanate (AUGMENTIN) 500-125 MG tablet Take 1 tablet by mouth 2 (two) times daily. For 7 days 03-23 to 03-30    [provider]  ?Doxylamine-Pyridoxine (DICLEGIS) 10-10 MG TBEC Take 1 tablet by mouth 2 (two) times daily as needed (nausea).  [provider]  ?gabapentin (NEURONTIN) 100 MG capsule Take 1 capsule (100 mg total) by mouth 3 (three) times daily. 04/22/21   Myra Rude, MD  ?methocarbamol (ROBAXIN) 500 MG tablet Take 1 tablet (500 mg total) by mouth 4 (four) times daily. 04/12/21   Elson Areas, PA-C  ?predniSONE (DELTASONE) 10 MG tablet 6,5,4,3,2,1 taper 04/12/21   Elson Areas, PA-C  ?Prenatal Vit-Fe Fumarate-FA (PRENATAL MULTIVITAMIN) TABS Take 1 tablet by mouth daily at 12 noon.    [provider]  ?promethazine (PHENERGAN) 12.5 MG tablet Take 1 tablet (12.5 mg total) by mouth every 6 (six) hours as needed for nausea or vomiting. 11/11/15   Marny Lowenstein, PA-C  ? ? ?Family History ?Family History  ?Problem Relation Age of Onset  ? Heart disease Paternal Grandmother   ? Stroke Paternal Grandmother   ? Diabetes Paternal Grandmother   ? Cancer Maternal Grandmother   ?      ? kind  ? Cancer Maternal Grandfather   ?     ? kind  ? Hypertension Mother   ? Sickle cell trait Sister   ? Hypertension Sister   ? Breast cancer Maternal Aunt   ? Cervical cancer Maternal Aunt   ? Other Child   ?     intestinal problems  ? Rheumatic fever Maternal Uncle   ? Hypertension Sister   ? ? ?Social History ?Social History  ? ?Tobacco Use  ? Smoking status: Never  ? Smokeless tobacco: Never  ?Substance Use Topics  ? Alcohol use: No  ? Drug use: No  ? ? ? ?Allergies   ?Patient has no known allergies. ? ? ?Review of Systems ?Review of Systems  ?Constitutional:  Negative for chills and fever.  ?HENT:  Positive for sore throat. Negative for congestion and sinus pain.   ?Respiratory: Negative.    ?Cardiovascular: Negative.   ?Gastrointestinal:  Positive for abdominal pain, nausea and vomiting.  ?Genitourinary: Negative.   ?Musculoskeletal: Negative.   ?Skin: Negative.   ?Neurological:  Positive for headaches.  ? ? ?Physical Exam ?Triage Vital Signs ?ED Triage Vitals  ?Enc Vitals Group  ?   BP 12/12/21 0835 130/86  ?   Pulse Rate 12/12/21 0835 85  ?   Resp 12/12/21 0835 17  ?   Temp 12/12/21 0835 98.2 ?F (36.8 ?C)  ?   Temp Source 12/12/21 0835 Oral  ?   SpO2 12/12/21 0835 100 %  ?   Weight 12/12/21 0834 190 lb 14.7 oz (86.6 kg)  ?   Height 12/12/21 0834 5' 5.5" (1.664 m)  ?   Head Circumference --   ?   Peak Flow --   ?   Pain Score 12/12/21 0834 0  ?   Pain Loc --   ?   Pain Edu? --   ?   Excl. in GC? --   ? ?No data found. ? ?Updated Vital Signs ?BP 130/86 (BP Location: Left Arm)   Pulse 85   Temp 98.2 ?F (36.8 ?C) (Oral)   Resp 17   Ht 5' 5.5" (1.664 m)   Wt 86.6 kg   SpO2 100%   BMI 31.29 kg/m?  ? ?Visual Acuity ?Right Eye Distance:   ?Left Eye Distance:   ?Bilateral Distance:   ? ?Right Eye Near:   ?Left Eye Near:    ?Bilateral Near:    ? ?Physical Exam ?Constitutional:   ?   Appearance: She is well-developed.  ?HENT:  ?  Head: Normocephalic and atraumatic.  ?   Left Ear: Tympanic membrane normal.   ?   Nose: Rhinorrhea present. No congestion.  ?   Mouth/Throat:  ?   Pharynx: Pharyngeal swelling and posterior oropharyngeal erythema present. No oropharyngeal exudate.  ?   Tonsils: No tonsillar exudate. 1+ on the right. 1+ on the left.  ?Cardiovascular:  ?   Rate and Rhythm: Normal rate and regular rhythm.  ?Pulmonary:  ?   Effort: Pulmonary effort is normal.  ?   Breath sounds: Normal breath sounds.  ?Abdominal:  ?   Palpations: Abdomen is soft.  ?   Tenderness: There is abdominal tenderness in the epigastric area and periumbilical area.  ?Musculoskeletal:  ?   Cervical back: Normal range of motion and neck supple.  ?Lymphadenopathy:  ?   Cervical: No cervical adenopathy.  ?Skin: ?   General: Skin is warm.  ?Neurological:  ?   Mental Status: She is alert.  ? ? ? ?UC Treatments / Results  ?Labs ?(all labs ordered are listed, but only abnormal results are displayed) ?Labs Reviewed  ?POCT RAPID STREP A, ED / UC  ? ? ?EKG ? ? ?Radiology ?No results found. ? ?Procedures ?Procedures (including critical care time) ? ?Medications Ordered in UC ?Medications - No data to display ? ?Initial Impression / Assessment and Plan / UC Course  ?I have reviewed the triage vital signs and the nursing notes. ? ?Pertinent labs & imaging results that were available during my care of the patient were reviewed by me and considered in my medical decision making (see chart for details). ? ?Patient was seen today for on/off sore throat, and now with n/v/abd pain.   ?Strep was negative, but will treat given the severity and duration of symptoms.  If culture is negative then may stop the abx.  ?Abdominal pain and nausea may be due to recent motrin/tylenol use for sore throat causing GI upset.  Will treat with zofran and PPI.  If not improving or worsening then advised to follow up for further evaluation.   ? ?Final Clinical Impressions(s) / UC Diagnoses  ? ?Final diagnoses:  ?Sore throat  ?Epigastric pain  ?Nausea and vomiting, unspecified  vomiting type  ? ? ? ?Discharge Instructions   ? ?  ?You were seen today for sore throat, nausea and abdominal pain.  ?Your rapid strep was negative, but I will send for culture.  I will start you on an antibiotic t

## 2021-12-12 NOTE — Discharge Instructions (Signed)
You were seen today for sore throat, nausea and abdominal pain.  ?Your rapid strep was negative, but I will send for culture.  I will start you on an antibiotic three times/day.  If the culturei s negative you may stop this.  Please get a new toothbrush in 2 days and clean/launder all pillow cases and bedding.  ?I have sent out a medication for nausea, and omeprazole for abdominal pain.  Please avoid motrin.  You may take tylenol for pain.  I also recommend salt water gargles.  ?If you are not improving into next week then please return for further evaluation.  ?

## 2021-12-12 NOTE — ED Triage Notes (Signed)
Pt reports sore throat with difficulty swallowing beginning Saturday, nausea, vomiting and headache beginning Tuesday. Denies fever.  ?

## 2021-12-14 LAB — CULTURE, GROUP A STREP (THRC)

## 2022-11-30 ENCOUNTER — Encounter: Payer: Self-pay | Admitting: *Deleted

## 2023-04-01 ENCOUNTER — Emergency Department (HOSPITAL_COMMUNITY)
Admission: EM | Admit: 2023-04-01 | Discharge: 2023-04-02 | Disposition: A | Discharge status: Home / Self Care | Payer: Self-pay | Attending: Emergency Medicine | Admitting: Emergency Medicine

## 2023-04-01 ENCOUNTER — Other Ambulatory Visit: Payer: Self-pay

## 2023-04-01 DIAGNOSIS — U071 COVID-19: Secondary | ICD-10-CM | POA: Insufficient documentation

## 2023-04-01 NOTE — ED Triage Notes (Signed)
Patient reports fever with runny nose , dry cough , chills and body aches onset last Saturday , respirations unlabored.

## 2023-04-01 NOTE — ED Provider Triage Note (Signed)
Emergency Medicine Provider Triage Evaluation Note  Sarah Duran , a 37 y.o. female  was evaluated in triage.  Pt complains of cough and sore throat that has been present since Saturday.  Patient endorses associated general malaise and myalgias since Sunday.  Last couple of days, patient states that she lost her voice.  She is not having any trouble breathing or trouble eating.  She denies chest pain apart from when coughing.  Review of Systems  Positive:  Negative: See above   Physical Exam  BP (!) 135/90 (BP Location: Right Arm)   Pulse (!) 101   Temp 99.3 F (37.4 C) (Oral)   Resp 15   SpO2 100%  Gen:   Awake, no distress   Resp:  Normal effort, clear to auscultation bilaterally MSK:   Moves extremities without difficulty  Other:  Anterior cervical lymphadenopathy.  There is some tonsillar hypertrophy present.  Uvula is midline and normal.  Tongue is normal.  Medical Decision Making  Medically screening exam initiated at 8:21 PM.  Appropriate orders placed.  Al Tabares was informed that the remainder of the evaluation will be completed by another provider, this initial triage assessment does not replace that evaluation, and the importance of remaining in the ED until their evaluation is complete.     Honor Loh [place], [place] 04/01/23 2023

## 2023-04-02 ENCOUNTER — Emergency Department (HOSPITAL_COMMUNITY): Payer: Self-pay

## 2023-04-02 LAB — GROUP A STREP BY PCR: Group A Strep by PCR: NOT DETECTED

## 2023-04-02 LAB — SARS CORONAVIRUS 2 BY RT PCR: SARS Coronavirus 2 by RT PCR: POSITIVE — AB

## 2023-04-02 MED ORDER — BENZONATATE 100 MG PO CAPS: 100.0000 mg | ORAL_CAPSULE | Freq: Two times a day (BID) | ORAL | 0 refills | Status: AC | PRN

## 2023-04-02 NOTE — ED Provider Notes (Signed)
[facility] Emergency Department Provider Note MRN:  [id number]  Arrival date & time: 04/02/23     Chief Complaint   Viral Illness   History of Present Illness   Sarah Duran is a 37 y.o. year-old female presents to the ED with chief complaint of sore throat, cough, fever, generalized body aches for the past week.  She also reports having lost her voice.  States that the sore throat and cough are worsening.  No successful treatments PTA.  History provided by patient.   Review of Systems  Pertinent positive and negative review of systems noted in HPI.    Physical Exam   Vitals:   04/02/23 0033 04/02/23 0200  BP:  (!) 99/58  Pulse: 93 68  Resp: 16   Temp: 98.5 F (36.9 C)   SpO2:  99%    CONSTITUTIONAL:  well-appearing, NAD NEURO:  Alert and oriented x 3, CN 3-12 grossly intact EYES:  eyes equal and reactive ENT/NECK:  Supple, no stridor, oropharynx is erythematous, without abscess, cervical adenopathy CARDIO:  normal rate, regular rhythm, appears well-perfused  PULM:  No respiratory distress, CTAB GI/GU:  non-distended,  MSK/SPINE:  No gross deformities, no edema, moves all extremities  SKIN:  no rash, atraumatic   *Additional and/or pertinent findings included in MDM below  Diagnostic and Interventional Summary    EKG Interpretation Date/Time:    Ventricular Rate:    PR Interval:    QRS Duration:    QT Interval:    QTC Calculation:   R Axis:      Text Interpretation:         Labs Reviewed  SARS CORONAVIRUS 2 BY RT PCR - Abnormal; Notable for the following components:      Result Value   SARS Coronavirus 2 by RT PCR POSITIVE (*)    All other components within normal limits  GROUP A STREP BY PCR    DG Chest 2 View  Final Result      Medications - No data to display   Procedures  /  Critical Care Procedures  ED Course and Medical Decision Making  I have reviewed the triage vital signs, the nursing notes, and  pertinent available records from the EMR.  Social Determinants Affecting Complexity of Care: Patient has no clinically significant social determinants affecting this chief complaint..   ED Course:    Medical Decision Making Patient here with cough, fever, sore throat and body aches for the past week.    Symptoms concerning for COVID.  Will check COVID, CXR, and strep.  Patient doesn't appear toxic.    Amount and/or Complexity of Data Reviewed Labs: ordered.    Details: COVID positive.  Will treat symptoms with tessalon perles.  Radiology: ordered and independent interpretation performed.    Details: No effusion or obvious opacity  Risk Prescription drug management.         Consultants: No consultations were needed in caring for this patient.   Treatment and Plan: Emergency department workup does not suggest an emergent condition requiring admission or immediate intervention beyond  what has been performed at this time. The patient is safe for discharge and has  been instructed to return immediately for worsening symptoms, change in  symptoms or any other concerns    Final Clinical Impressions(s) / ED Diagnoses     ICD-10-CM   1. COVID-19  U07.1       ED Discharge Orders          Ordered  benzonatate (TESSALON) 100 MG capsule  2 times daily PRN        04/02/23 0245              Discharge Instructions Discussed with and Provided to Patient:   Discharge Instructions   None      Roxy Horseman, PA-C 04/02/23 0248    Gilda Crease, MD 04/02/23 [phone]

## 2023-04-02 NOTE — ED Notes (Signed)
Called patient 3x to be brought back to a room, no answer
